# Patient Record
Sex: Female | Born: 1970 | Race: White | Hispanic: No | Marital: Married | Smoking: Never smoker
Health system: Southern US, Community
[De-identification: ages and names within clinical notes are randomized; demographics above are authoritative.]

## PROBLEM LIST (undated history)

## (undated) DIAGNOSIS — E669 Obesity, unspecified: Secondary | ICD-10-CM

## (undated) DIAGNOSIS — J309 Allergic rhinitis, unspecified: Secondary | ICD-10-CM

## (undated) DIAGNOSIS — C801 Malignant (primary) neoplasm, unspecified: Secondary | ICD-10-CM

## (undated) DIAGNOSIS — Z803 Family history of malignant neoplasm of breast: Secondary | ICD-10-CM

## (undated) DIAGNOSIS — K200 Eosinophilic esophagitis: Secondary | ICD-10-CM

## (undated) DIAGNOSIS — Z973 Presence of spectacles and contact lenses: Secondary | ICD-10-CM

## (undated) DIAGNOSIS — R002 Palpitations: Secondary | ICD-10-CM

## (undated) DIAGNOSIS — Z1509 Genetic susceptibility to other malignant neoplasm: Secondary | ICD-10-CM

## (undated) DIAGNOSIS — Z1501 Genetic susceptibility to malignant neoplasm of breast: Secondary | ICD-10-CM

## (undated) DIAGNOSIS — F419 Anxiety disorder, unspecified: Secondary | ICD-10-CM

## (undated) DIAGNOSIS — I493 Ventricular premature depolarization: Secondary | ICD-10-CM

## (undated) HISTORY — DX: Genetic susceptibility to malignant neoplasm of breast: Z15.01

## (undated) HISTORY — PX: UPPER GASTROINTESTINAL ENDOSCOPY: SHX188

## (undated) HISTORY — PX: ADENOIDECTOMY: SUR15

## (undated) HISTORY — DX: Genetic susceptibility to malignant neoplasm of breast: Z15.09

## (undated) HISTORY — DX: Obesity, unspecified: E66.9

## (undated) HISTORY — DX: Family history of malignant neoplasm of breast: Z80.3

## (undated) HISTORY — DX: Eosinophilic esophagitis: K20.0

## (undated) HISTORY — DX: Palpitations: R00.2

---

## 1999-03-25 ENCOUNTER — Ambulatory Visit (HOSPITAL_COMMUNITY): Admission: RE | Admit: 1999-03-25 | Discharge: 1999-03-25 | Payer: Self-pay | Admitting: Obstetrics and Gynecology

## 1999-03-25 ENCOUNTER — Encounter: Payer: Self-pay | Admitting: Obstetrics and Gynecology

## 1999-04-23 ENCOUNTER — Ambulatory Visit (HOSPITAL_COMMUNITY): Admission: RE | Admit: 1999-04-23 | Discharge: 1999-04-23 | Payer: Self-pay | Admitting: Obstetrics and Gynecology

## 1999-04-23 ENCOUNTER — Encounter: Payer: Self-pay | Admitting: Obstetrics and Gynecology

## 1999-08-21 ENCOUNTER — Inpatient Hospital Stay (HOSPITAL_COMMUNITY): Admission: AD | Admit: 1999-08-21 | Discharge: 1999-08-21 | Payer: Self-pay | Admitting: Obstetrics and Gynecology

## 1999-08-25 ENCOUNTER — Inpatient Hospital Stay (HOSPITAL_COMMUNITY): Admission: AD | Admit: 1999-08-25 | Discharge: 1999-08-25 | Payer: Self-pay | Admitting: Obstetrics and Gynecology

## 1999-08-28 ENCOUNTER — Encounter (HOSPITAL_COMMUNITY): Admission: RE | Admit: 1999-08-28 | Discharge: 1999-09-05 | Payer: Self-pay | Admitting: Obstetrics and Gynecology

## 1999-09-04 ENCOUNTER — Inpatient Hospital Stay (HOSPITAL_COMMUNITY): Admission: AD | Admit: 1999-09-04 | Discharge: 1999-09-07 | Payer: Self-pay | Admitting: Obstetrics and Gynecology

## 1999-10-22 ENCOUNTER — Other Ambulatory Visit: Admission: RE | Admit: 1999-10-22 | Discharge: 1999-10-22 | Payer: Self-pay | Admitting: Obstetrics and Gynecology

## 2004-12-05 ENCOUNTER — Emergency Department (HOSPITAL_COMMUNITY): Admission: EM | Admit: 2004-12-05 | Discharge: 2004-12-05 | Payer: Self-pay

## 2004-12-05 IMAGING — CR DG ANKLE COMPLETE 3+V*L*
3 series · 3 of 3 positions shown · non-contrast
Comparison: none

CLINICAL DATA: Ankle injury with lateral pain and swelling.
 LEFT ANKLE ? 3 VIEWS:
 There is lateral soft tissue swelling with a small ankle joint effusion.  No acute fracture or dislocation is demonstrated.  There is a small plantar calcaneal spur.

[view not recorded (1 of 3)]
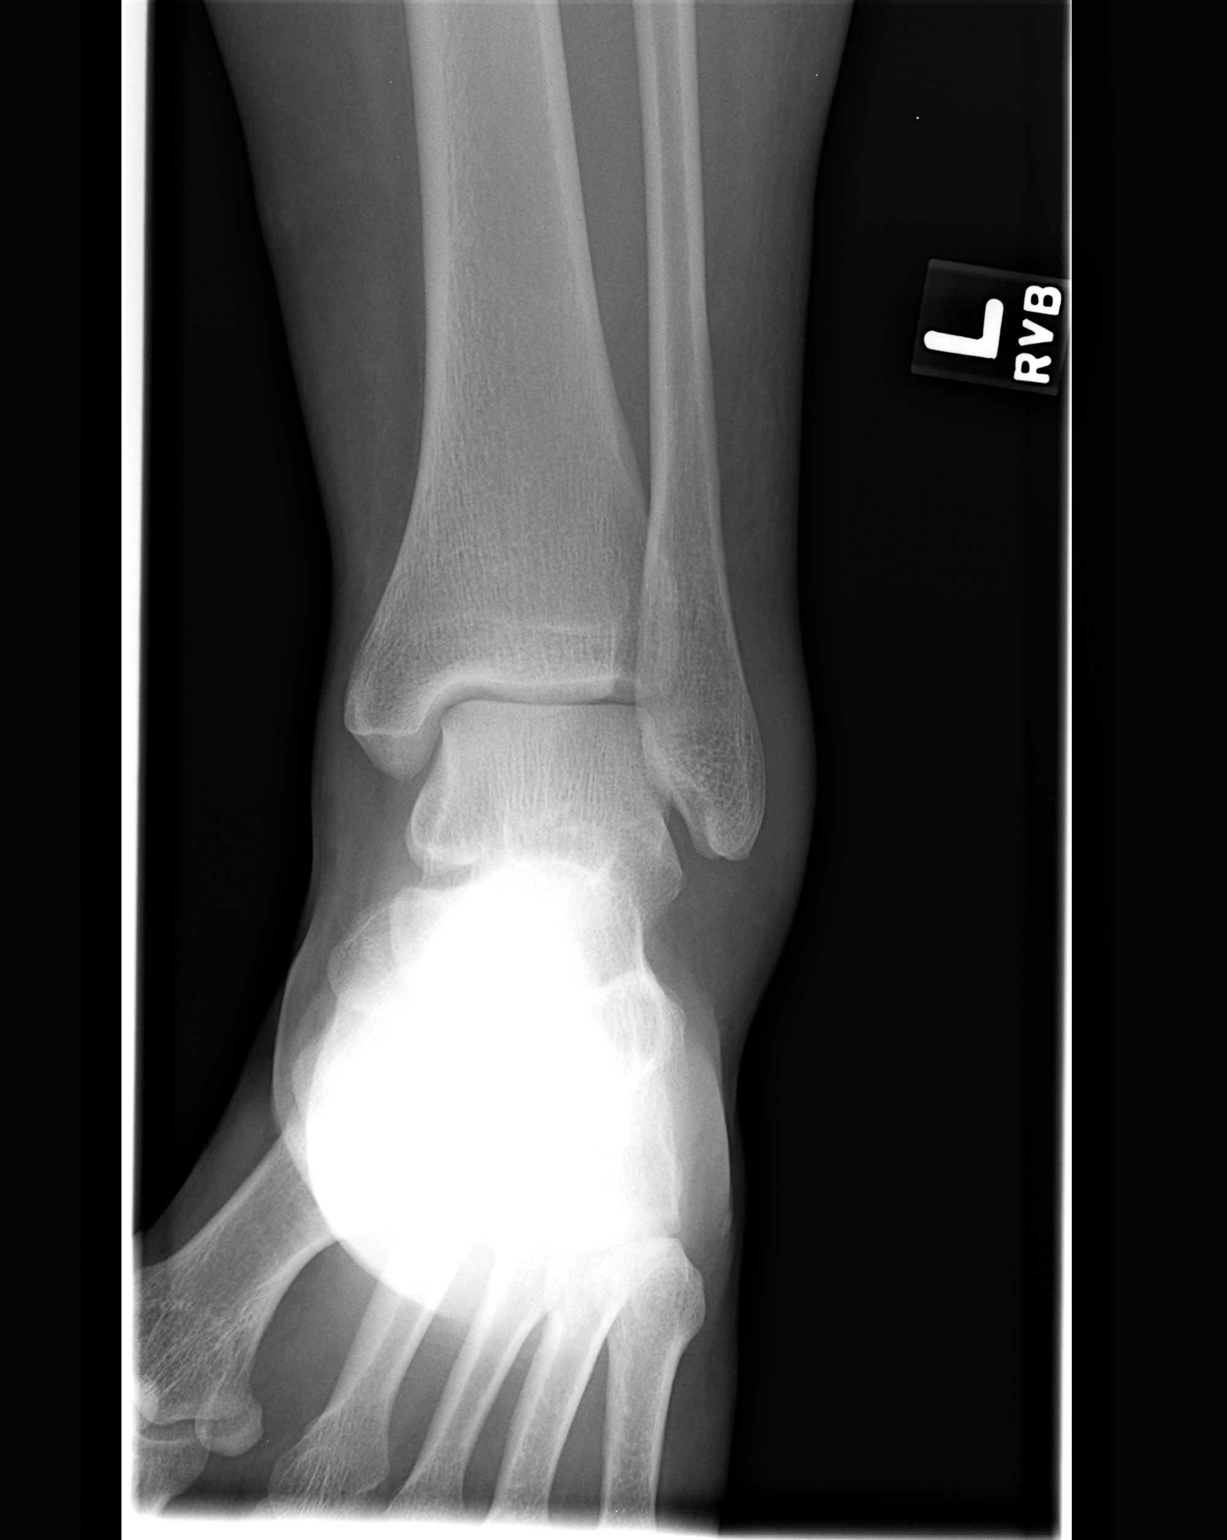

[view not recorded (2 of 3)]
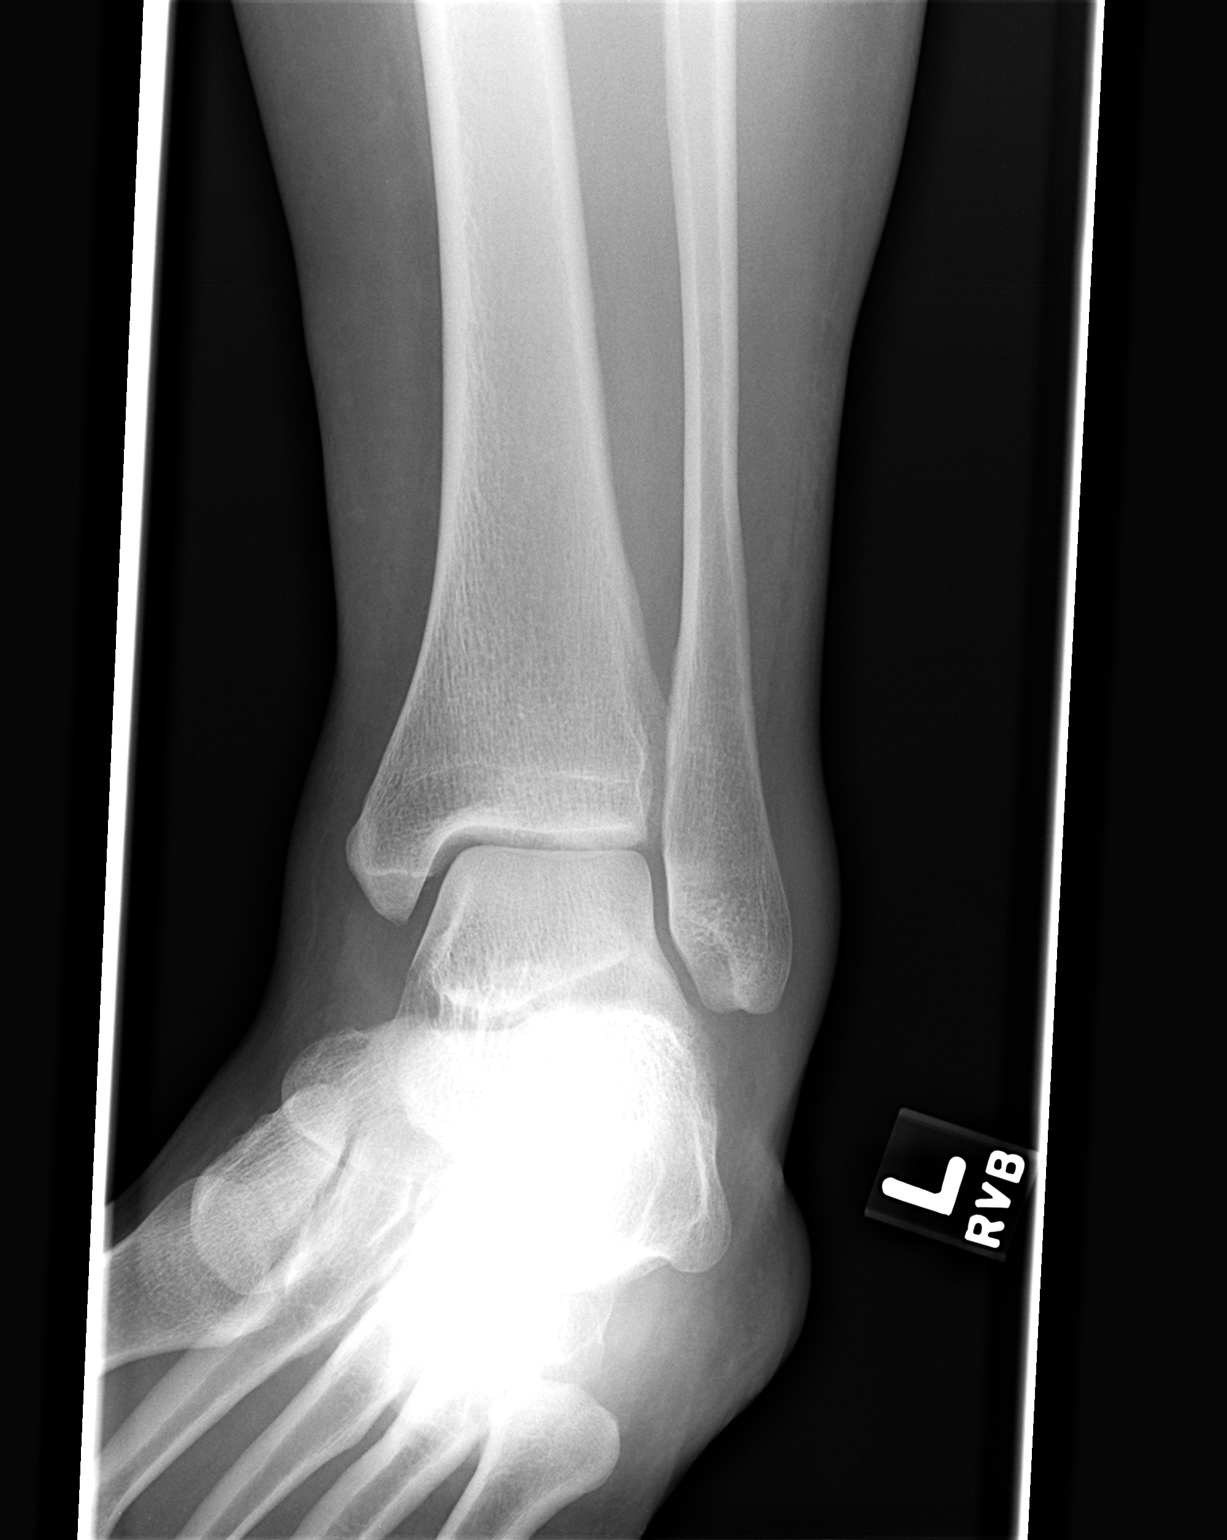

[view not recorded (3 of 3)]
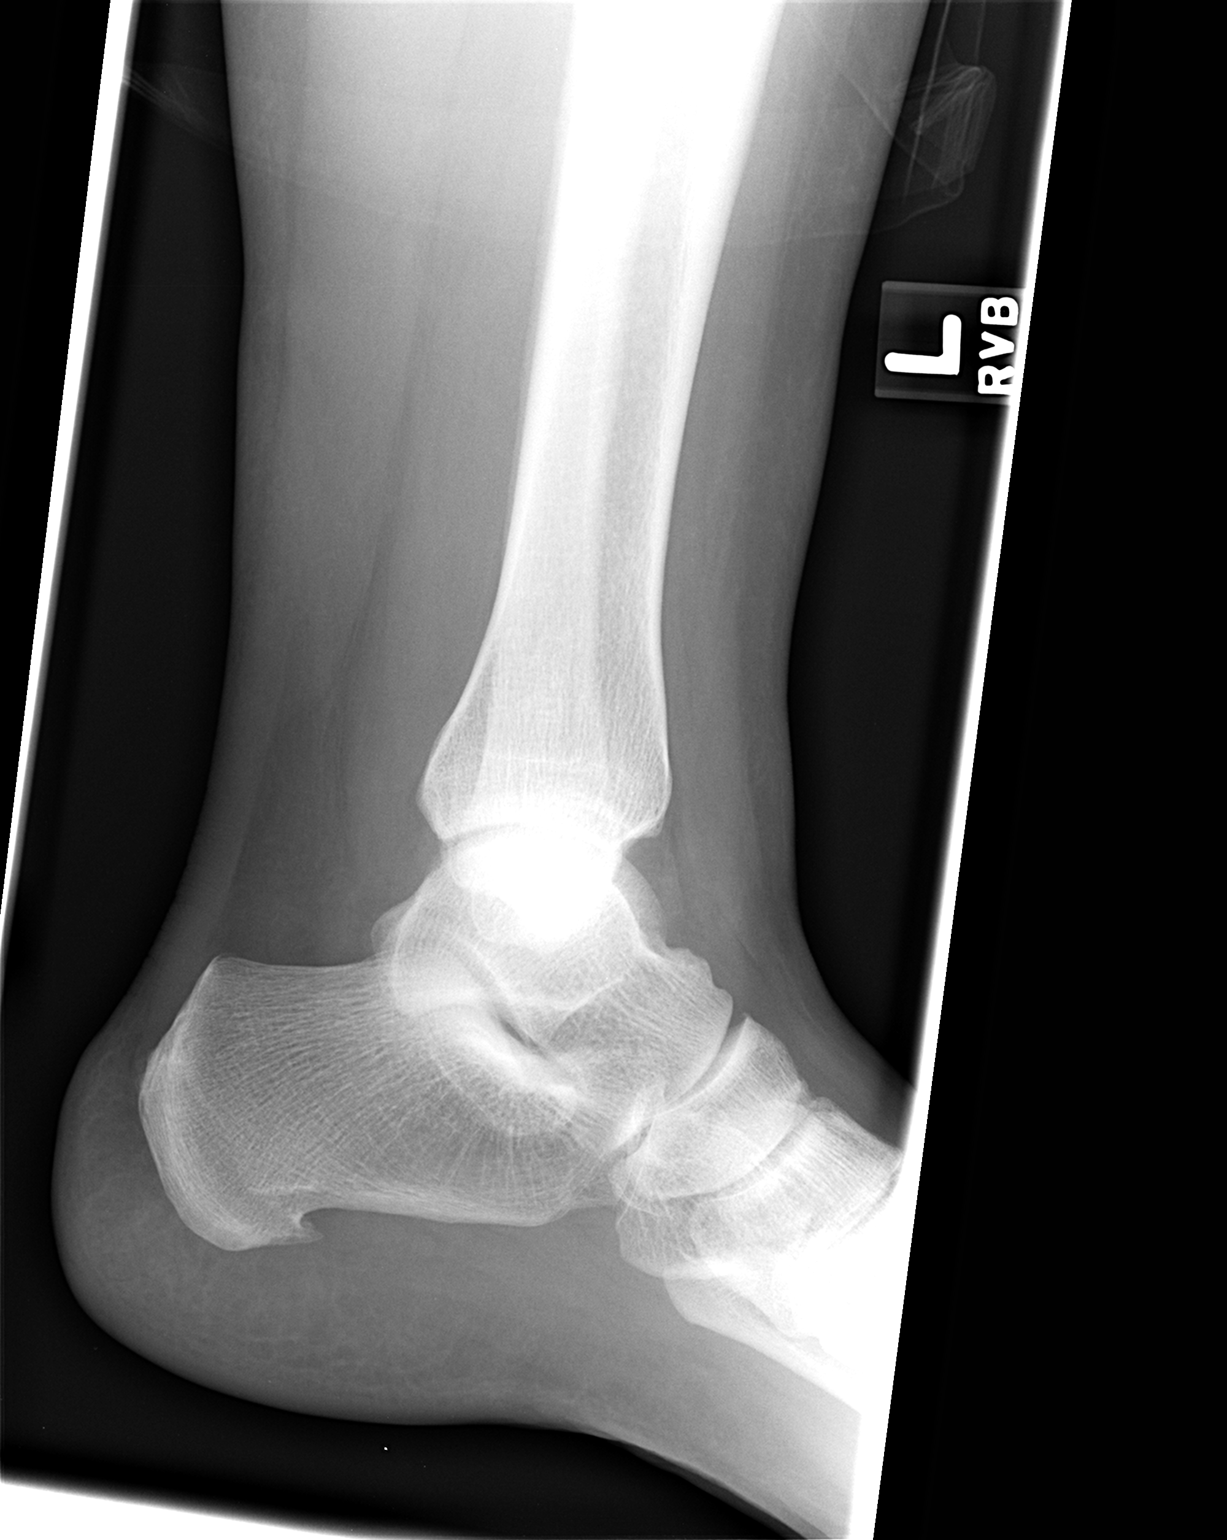

[3 of 3 positions shown; findings below may reference images not displayed]

IMPRESSION: Lateral soft tissue swelling.  No evidence of acute fracture or dislocation.

## 2009-01-10 ENCOUNTER — Encounter: Payer: Self-pay | Admitting: *Deleted

## 2011-10-14 ENCOUNTER — Encounter: Payer: Self-pay | Admitting: *Deleted

## 2011-10-15 ENCOUNTER — Ambulatory Visit (INDEPENDENT_AMBULATORY_CARE_PROVIDER_SITE_OTHER): Payer: 59 | Admitting: Cardiology

## 2011-10-15 ENCOUNTER — Encounter: Payer: Self-pay | Admitting: Cardiology

## 2011-10-15 DIAGNOSIS — I493 Ventricular premature depolarization: Secondary | ICD-10-CM | POA: Insufficient documentation

## 2011-10-15 DIAGNOSIS — E663 Overweight: Secondary | ICD-10-CM

## 2011-10-15 DIAGNOSIS — I4949 Other premature depolarization: Secondary | ICD-10-CM

## 2011-10-15 NOTE — Assessment & Plan Note (Signed)
We discussed diet and exercise strategies.

## 2011-10-15 NOTE — Progress Notes (Signed)
HPI The patient presents for evaluation of PVCs. She has had a history of this and an evaluation by Dr. Reyes Ivan in the past.  I was able to review these records. She had a negative exercise echocardiogram in the past. She's had normal thyroid studies. She does occasionally feel palpitations but these no longer bother her. She might notice him with caffeine. She notices them more with activity than at rest. She denies any presyncope or syncope. She had no chest pressure, neck or arm discomfort. She denies any shortness of breath, weight gain or edema. She's had recent evaluations including a GI evaluation and urgent care visit and they asked her to present again to the cardiologist as these seem to be frequent during their exam.  Allergies  Allergen Reactions  . Other Anaphylaxis    Watermelon, sweet potatoes, cucumbers,and oranges.    Current Outpatient Prescriptions  Medication Sig Dispense Refill  . fluticasone (FLOVENT HFA) 110 MCG/ACT inhaler Inhale 1 puff into the lungs 2 (two) times daily.         Past Medical History  Diagnosis Date  . Obesity   . Eosinophilic esophagitis     Hx of  . Palpitations     Past Surgical History  Procedure Date  . Adenoidectomy     Family History  Problem Relation Age of Onset  . Hyperlipidemia Mother   . Hypertension Mother     History   Social History  . Marital Status: Single    Spouse Name: N/A    Number of Children: 2  . Years of Education: N/A   Occupational History  . Manufacturing   .     Social History Main Topics  . Smoking status: Never Smoker   . Smokeless tobacco: Not on file  . Alcohol Use: Yes     occasional  . Drug Use: Not on file  . Sexually Active: Not on file   Other Topics Concern  . Not on file   Social History Narrative  . No narrative on file    ROS: As stated in the HPI and negative for all other systems.   PHYSICAL EXAM BP 136/77  Pulse 80  Resp 18  Ht 5\' 8"  (1.727 m)  Wt 265 lb 12.8 oz  (120.566 kg)  BMI 40.41 kg/m2 GENERAL:  Well appearing HEENT:  Pupils equal round and reactive, fundi not visualized, oral mucosa unremarkable NECK:  No jugular venous distention, waveform within normal limits, carotid upstroke brisk and symmetric, no bruits, no thyromegaly LYMPHATICS:  No cervical, inguinal adenopathy LUNGS:  Clear to auscultation bilaterally BACK:  No CVA tenderness CHEST:  Unremarkable HEART:  PMI not displaced or sustained,S1 and S2 within normal limits, no S3, no S4, no clicks, no rubs, no murmurs ABD:  Flat, positive bowel sounds normal in frequency in pitch, no bruits, no rebound, no guarding, no midline pulsatile mass, no hepatomegaly, no splenomegaly, obese EXT:  2 plus pulses throughout, no edema, no cyanosis no clubbing SKIN:  No rashes no nodules NEURO:  Cranial nerves II through XII grossly intact, motor grossly intact throughout PSYCH:  Cognitively intact, oriented to person place and time   EKG:  Sinus rhythm, premature ventricular contractions, axis within normal limits, intervals within normal limits, no acute ST-T wave changes.  ASSESSMENT AND PLAN

## 2011-10-15 NOTE — Assessment & Plan Note (Signed)
I would like to check a Holter to quantify these.  However, I doubt that any other cardiac work up will be indicated unless they become symptomatic.

## 2011-10-15 NOTE — Patient Instructions (Signed)
Your physician has recommended that you wear a holter monitor. Holter monitors are medical devices that record the heart's electrical activity. Doctors most often use these monitors to diagnose arrhythmias. Arrhythmias are problems with the speed or rhythm of the heartbeat. The monitor is a small, portable device. You can wear one while you do your normal daily activities. This is usually used to diagnose what is causing palpitations/syncope (passing out).  The current medical regimen is effective;  continue present plan and medications.  Follow up in 2-3 years with Dr Antoine Poche.  You will receive a letter in the mail 2 months before you are due.  Please call us when you receive this letter to schedule your follow up appointment.

## 2011-12-15 DIAGNOSIS — I493 Ventricular premature depolarization: Secondary | ICD-10-CM

## 2011-12-15 HISTORY — DX: Ventricular premature depolarization: I49.3

## 2016-06-09 LAB — HM PAP SMEAR: HPV Aptima: NEGATIVE

## 2016-12-14 HISTORY — PX: OTHER SURGICAL HISTORY: SHX169

## 2017-11-09 DIAGNOSIS — N920 Excessive and frequent menstruation with regular cycle: Secondary | ICD-10-CM | POA: Insufficient documentation

## 2017-11-19 ENCOUNTER — Other Ambulatory Visit: Payer: Self-pay | Admitting: Obstetrics and Gynecology

## 2017-11-19 DIAGNOSIS — R928 Other abnormal and inconclusive findings on diagnostic imaging of breast: Secondary | ICD-10-CM

## 2017-11-30 ENCOUNTER — Ambulatory Visit
Admission: RE | Admit: 2017-11-30 | Discharge: 2017-11-30 | Disposition: A | Discharge status: Home / Self Care | Payer: 59 | Source: Ambulatory Visit | Attending: Obstetrics and Gynecology | Admitting: Obstetrics and Gynecology

## 2017-11-30 ENCOUNTER — Ambulatory Visit: Payer: Self-pay

## 2017-11-30 DIAGNOSIS — R928 Other abnormal and inconclusive findings on diagnostic imaging of breast: Secondary | ICD-10-CM

## 2019-09-20 ENCOUNTER — Ambulatory Visit (INDEPENDENT_AMBULATORY_CARE_PROVIDER_SITE_OTHER): Payer: BC Managed Care – PPO | Admitting: Family Medicine

## 2019-09-20 ENCOUNTER — Encounter: Payer: Self-pay | Admitting: Family Medicine

## 2019-09-20 ENCOUNTER — Other Ambulatory Visit: Payer: Self-pay

## 2019-09-20 VITALS — BP 128/76 | HR 80 | Temp 97.9°F | Resp 16 | Ht 68.0 in | Wt 255.4 lb

## 2019-09-20 DIAGNOSIS — E669 Obesity, unspecified: Secondary | ICD-10-CM

## 2019-09-20 DIAGNOSIS — Z1509 Genetic susceptibility to other malignant neoplasm: Secondary | ICD-10-CM

## 2019-09-20 DIAGNOSIS — Z1501 Genetic susceptibility to malignant neoplasm of breast: Secondary | ICD-10-CM | POA: Diagnosis not present

## 2019-09-20 DIAGNOSIS — Z803 Family history of malignant neoplasm of breast: Secondary | ICD-10-CM | POA: Diagnosis not present

## 2019-09-20 NOTE — Progress Notes (Signed)
Subjective  CC:  Chief Complaint  Patient presents with  . Establish Care    Has not had PCP in years; OB/GYN is Dr. Terri Piedra  . Weight Loss    Curently doing weight watchers and wants to discuss a healthy weight.. Has lost 45 lb since June 2020    HPI: Becky Taylor is a 48 y.o. female who presents to [facility] [place] today to establish care with me as a new patient.   She has the following concerns or needs:  48 year old married female mother of 2 daughters who are now in college presents to establish care.  Recently had annual GYN exam.  Obesity: Has been overweight for most of her adult life.  Typical weight has ranged between 230 and 290 pounds.  Recently started weight watchers and is down 45 pounds in the last 4 months.  She is feeling much better.  Would like to continue to lose more weight.  She has started walking on a regular basis.  Eating healthier.  BRCA positive gene testing: Mother was diagnosed with breast cancer 2 years ago.  She is doing well.  Mother was tested for BRCA and was positive, follow-up testing with patient was also positive.  This was done 1 or 2 years ago, however she has not had follow-up for this test.  She is never been to Dietitian but recently was given a phone number to make an appointment.  She has not told family members about this test.  She is uncertain if she will act upon it.  Her mammogram is up-to-date but she has not had any other sort of high risk surveillance or screening done.  Pap smears up-to-date.  She is elevated risk for breast cancer and ovarian cancer.  Health maintenance: Due for complete physical with lab work.  She is due for Tdap but would like to defer that for now.  Is getting flu shot today at work.  Assessment  1. Obesity (BMI 30-39.9)   2. BRCA gene mutation positive   3. Family history of breast cancer in mother      Plan   Obesity: Had long discussion regarding weight loss goals  and ways to achieve this.  She will increase exercise, strength training continue weight watchers for now.  BRCA gene mutation positive: Recommend referral to geneticist.  Patient will call to schedule.  Discussed options.  Return for complete physical.  Tdap at that time.  Flu shot today.  Follow up:  Return for complete physical. No orders of the defined types were placed in this encounter.  No orders of the defined types were placed in this encounter.    Depression screen PHQ 2/9 09/20/2019  Decreased Interest 0  Down, Depressed, Hopeless 0  PHQ - 2 Score 0    We updated and reviewed the patient's past history in detail and it is documented below.  Patient Active Problem List   Diagnosis Date Noted  . Obesity (BMI 30-39.9) 09/20/2019  . BRCA gene mutation positive 09/20/2019  . Family history of breast cancer in mother 09/20/2019  . Menorrhagia 11/09/2017  . PVC (premature ventricular contraction) 10/15/2011   Health Maintenance  Topic Date Due  . HIV Screening  02/09/1986  . TETANUS/TDAP  02/09/1990  . MAMMOGRAM  07/16/2020  . PAP SMEAR-Modifier  07/16/2022  . INFLUENZA VACCINE  Completed    There is no immunization history on file for this patient. No outpatient medications have been marked  as taking for the 09/20/19 encounter (Office Visit) with Leamon Arnt, MD.    Allergies: Patient is allergic to other. Past Medical History Patient  has a past medical history of Eosinophilic esophagitis, Obesity, and Palpitations. Past Surgical History Patient  has a past surgical history that includes Adenoidectomy. Family History: Patient family history includes BRCA 1/2 in her mother; Breast cancer in her mother; Healthy in her sister; Hyperlipidemia in her mother; Hypertension in her mother; Neurologic Disorder in her father. Social History:  Patient  reports that she has never smoked. She has never used smokeless tobacco. She reports current alcohol use. She reports  that she does not use drugs.  Review of Systems: Constitutional: negative for fever or malaise Ophthalmic: negative for photophobia, double vision or loss of vision Cardiovascular: negative for chest pain, dyspnea on exertion, or new LE swelling Respiratory: negative for SOB or persistent cough Gastrointestinal: negative for abdominal pain, change in bowel habits or melena Genitourinary: negative for dysuria or gross hematuria Musculoskeletal: negative for new gait disturbance or muscular weakness Integumentary: negative for new or persistent rashes Neurological: negative for TIA or stroke symptoms Psychiatric: negative for SI or delusions Allergic/Immunologic: negative for hives  Patient Care Team    Relationship Specialty Notifications Start End  Leamon Arnt, MD PCP - General Family Medicine  09/20/19   Sherlyn Hay, DO Consulting Physician Obstetrics and Gynecology  09/20/19     Objective  Vitals: BP 128/76   Pulse 80   Temp 97.9 F (36.6 C) (Tympanic)   Resp 16   Ht _0  (1.727 m)   Wt 255 lb 6.4 oz (115.8 kg)   SpO2 99%   BMI 38.83 kg/m  General:  Well developed, well nourished, no acute distress  Psych:  Alert and oriented,normal mood and affect HEENT:  Normocephalic, atraumatic, non-icteric sclera, PERRL, oropharynx is without mass or exudate, supple neck without adenopathy, mass or thyromegaly Cardiovascular:  RRR without gallop, rub or murmur, nondisplaced PMI Respiratory:  Good breath sounds bilaterally, CTAB with normal respiratory effort MSK: no deformities, contusions. Joints are without erythema or swelling Neurologic:    Mental status is normal. Gross motor and sensory exams are normal. Normal gait   Commons side effects, risks, benefits, and alternatives for medications and treatment plan prescribed today were discussed, and the patient expressed understanding of the given instructions. Patient is instructed to call or message via MyChart if he/she  has any questions or concerns regarding our treatment plan. No barriers to understanding were identified. We discussed Red Flag symptoms and signs in detail. Patient expressed understanding regarding what to do in case of urgent or emergency type symptoms.   Medication list was reconciled, printed and provided to the patient in AVS. Patient instructions and summary information was reviewed with the patient as documented in the AVS. This note was prepared with assistance of Dragon voice recognition software. Occasional wrong-word or sound-a-like substitutions may have occurred due to the inherent limitations of voice recognition software

## 2019-09-20 NOTE — Patient Instructions (Signed)
Please return for your annual complete physical; please come fasting.  Please call for your genetics appointment. Let me know if you have any questions afterwards that I can help with.  And keep up the healthy lifestyle changes!! You are doing great!  It was a pleasure meeting you today! Thank you for choosing [place] to meet your healthcare needs! I truly look forward to working with you. If you have any questions or concerns, please send me a message via Mychart or call the office at [phone].   BMI for Adults  Body mass index (BMI) is a number that is calculated from a person's weight and height. BMI may help to estimate how much of a person's weight is composed of fat. BMI can help identify those who may be at higher risk for certain medical problems. How is BMI used with adults? BMI is used as a screening tool to identify possible weight problems. It is used to check whether a person is obese, overweight, healthy weight, or underweight. How is BMI calculated? BMI measures your weight and compares it to your height. This can be done either in [place] ([place] or metric measurements. Note that charts are available to help you find your BMI quickly and easily without having to do these calculations yourself. To calculate your BMI in [place] ([place] measurements, your health care provider will: 1. Measure your weight in pounds (lb). 2. Multiply the number of pounds by 703. ? For example, for a person who weighs 180 lb, multiply that number by 703, which equals 126,540. 3. Measure your height in inches (in). Then multiply that number by itself to get a measurement called "inches squared." ? For example, for a person who is 70 in tall, the "inches squared" measurement is 70 in x 70 in, which equals 4900 inches squared. 4. Divide the total from Step 2 (number of lb x 703) by the total from Step 3 (inches squared): 126,540  4900 = 25.8. This is your BMI. To calculate your BMI in metric measurements,  your health care provider will: 1. Measure your weight in kilograms (kg). 2. Measure your height in meters (m). Then multiply that number by itself to get a measurement called "meters squared." ? For example, for a person who is 1.75 m tall, the "meters squared" measurement is 1.75 m x 1.75 m, which is equal to 3.1 meters squared. 3. Divide the number of kilograms (your weight) by the meters squared number. In this example: 70  3.1 = 22.6. This is your BMI. How is BMI interpreted? To interpret your results, your health care provider will use BMI charts to identify whether you are underweight, normal weight, overweight, or obese. The following guidelines will be used:  Underweight: BMI less than 18.5.  Normal weight: BMI between 18.5 and 24.9.  Overweight: BMI between 25 and 29.9.  Obese: BMI of 30 and above. Please note:  Weight includes both fat and muscle, so someone with a muscular build, such as an athlete, may have a BMI that is higher than 24.9. In cases like these, BMI is not an accurate measure of body fat.  To determine if excess body fat is the cause of a BMI of 25 or higher, further assessments may need to be done by a health care provider.  BMI is usually interpreted in the same way for men and women. Why is BMI a useful tool? BMI is useful in two ways:  Identifying a weight problem that may be related to  a medical condition, or that may increase the risk for medical problems.  Promoting lifestyle and diet changes in order to reach a healthy weight. Summary  Body mass index (BMI) is a number that is calculated from a person's weight and height.  BMI may help to estimate how much of a person's weight is composed of fat. BMI can help identify those who may be at higher risk for certain medical problems.  BMI can be measured using English measurements or metric measurements.  To interpret your results, your health care provider will use BMI charts to identify whether you  are underweight, normal weight, overweight, or obese. This information is not intended to replace advice given to you by your health care provider. Make sure you discuss any questions you have with your health care provider. Document Released: 08/11/2004 Document Revised: 11/12/2017 Document Reviewed: 10/13/2017 Elsevier Patient Education  2020 [employer].

## 2019-09-21 ENCOUNTER — Encounter: Payer: Self-pay | Admitting: *Deleted

## 2019-10-24 ENCOUNTER — Other Ambulatory Visit: Payer: Self-pay

## 2019-10-24 ENCOUNTER — Ambulatory Visit (INDEPENDENT_AMBULATORY_CARE_PROVIDER_SITE_OTHER): Payer: BC Managed Care – PPO | Admitting: Family Medicine

## 2019-10-24 ENCOUNTER — Encounter: Payer: Self-pay | Admitting: Family Medicine

## 2019-10-24 VITALS — BP 122/72 | HR 81 | Temp 98.7°F | Ht 68.0 in | Wt 247.0 lb

## 2019-10-24 DIAGNOSIS — Z1501 Genetic susceptibility to malignant neoplasm of breast: Secondary | ICD-10-CM

## 2019-10-24 DIAGNOSIS — G250 Essential tremor: Secondary | ICD-10-CM

## 2019-10-24 DIAGNOSIS — Z0000 Encounter for general adult medical examination without abnormal findings: Secondary | ICD-10-CM

## 2019-10-24 DIAGNOSIS — Z23 Encounter for immunization: Secondary | ICD-10-CM

## 2019-10-24 DIAGNOSIS — E669 Obesity, unspecified: Secondary | ICD-10-CM | POA: Diagnosis not present

## 2019-10-24 DIAGNOSIS — Z1509 Genetic susceptibility to other malignant neoplasm: Secondary | ICD-10-CM

## 2019-10-24 LAB — COMPREHENSIVE METABOLIC PANEL
ALT: 10 U/L (ref 0–35)
AST: 11 U/L (ref 0–37)
Albumin: 4 g/dL (ref 3.5–5.2)
Alkaline Phosphatase: 55 U/L (ref 39–117)
BUN: 10 mg/dL (ref 6–23)
CO2: 27 mEq/L (ref 19–32)
Calcium: 8.9 mg/dL (ref 8.4–10.5)
Chloride: 105 mEq/L (ref 96–112)
Creatinine, Ser: 0.62 mg/dL (ref 0.40–1.20)
GFR: 102.44 mL/min (ref >60.00)
Glucose, Bld: 92 mg/dL (ref 70–99)
Potassium: 4.4 mEq/L (ref 3.5–5.1)
Sodium: 138 mEq/L (ref 135–145)
Total Bilirubin: 0.3 mg/dL (ref 0.2–1.2)
Total Protein: 6.6 g/dL (ref 6.0–8.3)

## 2019-10-24 LAB — CBC WITH DIFFERENTIAL/PLATELET
Basophils Absolute: 0.1 10*3/uL (ref 0.0–0.1)
Basophils Relative: 1 % (ref 0.0–3.0)
Eosinophils Absolute: 0.2 10*3/uL (ref 0.0–0.7)
Eosinophils Relative: 3.5 % (ref 0.0–5.0)
HCT: 39.2 % (ref 36.0–46.0)
Hemoglobin: 13.1 g/dL (ref 12.0–15.0)
Lymphocytes Relative: 22.1 % (ref 12.0–46.0)
Lymphs Abs: 1.3 10*3/uL (ref 0.7–4.0)
MCHC: 33.4 g/dL (ref 30.0–36.0)
MCV: 86.8 fl (ref 78.0–100.0)
Monocytes Absolute: 0.4 10*3/uL (ref 0.1–1.0)
Monocytes Relative: 6.7 % (ref 3.0–12.0)
Neutro Abs: 3.8 10*3/uL (ref 1.4–7.7)
Neutrophils Relative %: 66.7 % (ref 43.0–77.0)
Platelets: 304 10*3/uL (ref 150.0–400.0)
RBC: 4.52 Mil/uL (ref 3.87–5.11)
RDW: 14.3 % (ref 11.5–15.5)
WBC: 5.8 10*3/uL (ref 4.0–10.5)

## 2019-10-24 LAB — LIPID PANEL
Cholesterol: 177 mg/dL (ref 0–200)
HDL: 40.4 mg/dL (ref >39.00)
LDL Cholesterol: 113 mg/dL — ABNORMAL HIGH (ref 0–99)
NonHDL: 137.02
Total CHOL/HDL Ratio: 4
Triglycerides: 119 mg/dL (ref 0.0–149.0)
VLDL: 23.8 mg/dL (ref 0.0–40.0)

## 2019-10-24 LAB — TSH: TSH: 1.94 u[IU]/mL (ref 0.35–4.50)

## 2019-10-24 NOTE — Patient Instructions (Addendum)
Please return in 12 months for your annual complete physical; please come fasting.  I will release your lab results to you on your MyChart account with further instructions. Please reply with any questions.   Today you were given your Tdap vaccination.   Please call for your genetic counselor appointment today! :)  If you have any questions or concerns, please don't hesitate to send me a message via MyChart or call the office at [phone]. Thank you for visiting with [place] today! It's our pleasure caring for you.  Please do these things to maintain good health!   Exercise at least 30-45 minutes a day,  4-5 days a week.   Eat a low-fat diet with lots of fruits and vegetables, up to 7-9 servings per day.  Drink plenty of water daily. Try to drink 8 8oz glasses per day.  Seatbelts can save your life. Always wear your seatbelt.  Place Smoke Detectors on every level of your home and check batteries every year.  Schedule an appointment with an eye doctor for an eye exam every 1-2 years  Safe sex - use condoms to protect yourself from STDs if you could be exposed to these types of infections. Use birth control if you do not want to become pregnant and are sexually active.  Avoid heavy alcohol use. If you drink, keep it to less than 2 drinks/day and not every day.  Faith.  Choose someone you trust that could speak for you if you became unable to speak for yourself.  Depression is common in our stressful world.If you're feeling down or losing interest in things you normally enjoy, please come in for a visit.  If anyone is threatening or hurting you, please get help. Physical or Emotional Violence is never OK.    Essential Tremor A tremor is trembling or shaking that a person cannot control. Most tremors affect the hands or arms. Tremors can also affect the head, vocal cords, legs, and other parts of the body. Essential tremor is a tremor without a known cause.  Usually, it occurs while a person is trying to perform an action. It tends to get worse gradually as a person ages. What are the causes? The cause of this condition is not known. What increases the risk? You are more likely to develop this condition if:  You have a family member with essential tremor.  You are age 48 or older.  You take certain medicines. What are the signs or symptoms? The main sign of a tremor is a rhythmic shaking of certain parts of your body that is uncontrolled and unintentional. You may:  Have difficulty eating with a spoon or fork.  Have difficulty writing.  Nod your head up and down or side to side.  Have a quivering voice. The shaking may:  Get worse over time.  Come and go.  Be more noticeable on one side of your body.  Get worse due to stress, fatigue, caffeine, and extreme heat or cold. How is this diagnosed? This condition may be diagnosed based on:  Your symptoms and medical history.  A physical exam. There is no single test to diagnose an essential tremor. However, your health care provider may order tests to rule out other causes of your condition. These may include:  Blood and urine tests.  Imaging studies of your brain, such as CT scan and MRI.  A test that measures involuntary muscle movement (electromyogram). How is this treated? Treatment for essential  tremor depends on the severity of the condition.  Some tremors may go away without treatment.  Mild tremors may not need treatment if they do not affect your day-to-day life.  Severe tremors may need to be treated using one or more of the following options: ? Medicines. ? Lifestyle changes. ? Occupational or physical therapy. Follow these instructions at home: Lifestyle   Do not use any products that contain nicotine or tobacco, such as cigarettes and e-cigarettes. If you need help quitting, ask your health care provider.  Limit your caffeine intake as told by your  health care provider.  Try to get 8 hours of sleep each night.  Find ways to manage your stress that fits your lifestyle and personality. Consider trying meditation or yoga.  Try to anticipate stressful situations and allow extra time to manage them.  If you are struggling emotionally with the effects of your tremor, consider working with a mental health provider. General instructions  Take over-the-counter and prescription medicines only as told by your health care provider.  Avoid extreme heat and extreme cold.  Keep all follow-up visits as told by your health care provider. This is important. Visits may include physical therapy visits. Contact a health care provider if:  You experience any changes in the location or intensity of your tremors.  You start having a tremor after starting a new medicine.  You have tremor with other symptoms, such as: ? Numbness. ? Tingling. ? Pain. ? Weakness.  Your tremor gets worse.  Your tremor interferes with your daily life.  You feel down, blue, or sad for at least 2 weeks in a row.  Worrying about your tremor and what other people think about you interferes with your everyday life functions, including relationships, work, or school. Summary  Essential tremor is a tremor without a known cause. Usually, it occurs when you are trying to perform an action.  The cause of this condition is not known.  The main sign of a tremor is a rhythmic shaking of certain parts of your body that is uncontrolled and unintentional.  Treatment for essential tremor depends on the severity of the condition. This information is not intended to replace advice given to you by your health care provider. Make sure you discuss any questions you have with your health care provider. Document Released: 12/21/2014 Document Revised: 12/10/2017 Document Reviewed: 12/10/2017 Elsevier Patient Education  2020 [employer].

## 2019-10-24 NOTE — Progress Notes (Signed)
Subjective  Chief Complaint  Patient presents with  . Annual Exam    HPI: Becky Taylor is a 48 y.o. female who presents to [facility] today for a Female Wellness Visit.   Wellness Visit: annual visit with health maintenance review and exam without Pap   HM: GYN exam by Dr. Terri Piedra. + BRCA: still needs to get set up with genetic counselor. Weight loss continues to go well. Averaging about 7-10 pounds per month: exercise and weight watchers. No new concerns.  Wt Readings from Last 3 Encounters:  10/24/19 247 lb (112 kg)  09/20/19 255 lb 6.4 oz (115.8 kg)  10/15/11 265 lb 12.8 oz (120.6 kg)    Assessment  1. Annual physical exam   2. BRCA gene mutation positive   3. Obesity (BMI 30-39.9)   4. Essential tremor      Plan  Female Wellness Visit:  Age appropriate Health Maintenance and Prevention measures were discussed with patient. Included topics are cancer screening recommendations, ways to keep healthy (see AVS) including dietary and exercise recommendations, regular eye and dental care, use of seat belts, and avoidance of moderate alcohol use and tobacco use.   BMI: discussed patient's BMI and encouraged positive lifestyle modifications to help get to or maintain a target BMI.  HM needs and immunizations were addressed and ordered. See below for orders. See HM and immunization section for updates. tdap today  Routine labs and screening tests ordered including cmp, cbc and lipids where appropriate.  Discussed recommendations regarding Vit D and calcium supplementation (see AVS)  Referral to genetics counselor per gyn; pt to call for appt. Discussed importance.   New dx: mild essential tremor. Education given and monitor for now. Not terribly bothersome. Check tsh.   Follow up: Return in about 1 year (around 10/23/2020) for complete physical.   Orders Placed This Encounter  Procedures  . CBC with Differential/Platelet  . Comprehensive  metabolic panel  . Lipid panel  . HIV antibody  . TSH   No orders of the defined types were placed in this encounter.    Lifestyle: Body mass index is 37.56 kg/m. Wt Readings from Last 3 Encounters:  10/24/19 247 lb (112 kg)  09/20/19 255 lb 6.4 oz (115.8 kg)  10/15/11 265 lb 12.8 oz (120.6 kg)    Patient Active Problem List   Diagnosis Date Noted  . Essential tremor 10/24/2019  . Obesity (BMI 30-39.9) 09/20/2019  . BRCA gene mutation positive 09/20/2019  . Family history of breast cancer in mother 09/20/2019  . Menorrhagia 11/09/2017  . PVC (premature ventricular contraction) 10/15/2011   Health Maintenance  Topic Date Due  . HIV Screening  02/09/1986  . TETANUS/TDAP  02/09/1990  . MAMMOGRAM  07/16/2020  . PAP SMEAR-Modifier  07/16/2022  . INFLUENZA VACCINE  Completed    There is no immunization history on file for this patient. We updated and reviewed the patient's past history in detail and it is documented below. Allergies: Patient is allergic to other. Past Medical History Patient  has a past medical history of Eosinophilic esophagitis, Obesity, and Palpitations. Past Surgical History Patient  has a past surgical history that includes Adenoidectomy. Family History: Patient family history includes Arthritis in her paternal grandmother; Asthma in her paternal grandmother; BRCA 1/2 in her mother; Breast cancer in her mother; Cancer in her maternal grandfather and paternal grandfather; Healthy in her sister; Hearing loss in her paternal grandfather and paternal grandmother; Heart attack in her  maternal grandfather; Heart disease in her maternal grandfather; High Cholesterol in her maternal grandmother; Hyperlipidemia in her mother; Hypertension in her mother; Neurologic Disorder in her father; Stroke in her maternal grandmother. Social History:  Patient  reports that she has never smoked. She has never used smokeless tobacco. She reports current alcohol use. She reports  that she does not use drugs.  Review of Systems: Constitutional: negative for fever or malaise Ophthalmic: negative for photophobia, double vision or loss of vision Cardiovascular: negative for chest pain, dyspnea on exertion, or new LE swelling Respiratory: negative for SOB or persistent cough Gastrointestinal: negative for abdominal pain, change in bowel habits or melena Genitourinary: negative for dysuria or gross hematuria, no abnormal uterine bleeding or disharge Musculoskeletal: negative for new gait disturbance or muscular weakness Integumentary: negative for new or persistent rashes, no breast lumps Neurological: negative for TIA or stroke symptoms Psychiatric: negative for SI or delusions Allergic/Immunologic: negative for hives  Patient Care Team    Relationship Specialty Notifications Start End  Leamon Arnt, MD PCP - General Family Medicine  09/20/19   Sherlyn Hay, DO Consulting Physician Obstetrics and Gynecology  09/20/19     Objective  Vitals: BP 122/72 (BP Location: Left Arm, Patient Position: Sitting, Cuff Size: Normal)   Pulse 81   Temp 98.7 F (37.1 C) (Temporal)   Ht _0  (1.727 m)   Wt 247 lb (112 kg)   SpO2 98%   BMI 37.56 kg/m  General:  Well developed, well nourished, no acute distress  Psych:  Alert and orientedx3,normal mood and affect HEENT:  Normocephalic, atraumatic, non-icteric sclera, PERRL, oropharynx is clear without mass or exudate, supple neck without adenopathy, mass or thyromegaly Cardiovascular:  Normal S1, S2, RRR without gallop, rub or murmur, nondisplaced PMI Respiratory:  Good breath sounds bilaterally, CTAB with normal respiratory effort Gastrointestinal: normal bowel sounds, soft, non-tender, no noted masses. No HSM MSK: no deformities, contusions. Joints are without erythema or swelling. Spine and CVA region are nontender Skin:  Warm, no rashes or suspicious lesions noted Neurologic:    Mental status is normal. CN 2-11  are normal. Gross motor and sensory exams are normal. Normal gait. + fine tremor   Commons side effects, risks, benefits, and alternatives for medications and treatment plan prescribed today were discussed, and the patient expressed understanding of the given instructions. Patient is instructed to call or message via MyChart if he/she has any questions or concerns regarding our treatment plan. No barriers to understanding were identified. We discussed Red Flag symptoms and signs in detail. Patient expressed understanding regarding what to do in case of urgent or emergency type symptoms.   Medication list was reconciled, printed and provided to the patient in AVS. Patient instructions and summary information was reviewed with the patient as documented in the AVS. This note was prepared with assistance of Dragon voice recognition software. Occasional wrong-word or sound-a-like substitutions may have occurred due to the inherent limitations of voice recognition software

## 2019-10-25 LAB — HIV ANTIBODY (ROUTINE TESTING W REFLEX): HIV 1&2 Ab, 4th Generation: NONREACTIVE

## 2019-10-31 ENCOUNTER — Telehealth: Payer: Self-pay | Admitting: Genetic Counselor

## 2019-10-31 NOTE — Telephone Encounter (Signed)
A genetic counseling appt has been scheduled for Becky Taylor to see Roma Kayser on 12/7 at 9am. Pt aware to arrive 15 minutes early.

## 2019-11-10 ENCOUNTER — Telehealth: Payer: Self-pay | Admitting: Genetic Counselor

## 2019-11-10 NOTE — Telephone Encounter (Signed)
Called patient regarding 12/07 appointment, left a voicemail.

## 2019-11-17 ENCOUNTER — Telehealth: Payer: Self-pay | Admitting: Genetic Counselor

## 2019-11-17 NOTE — Telephone Encounter (Signed)
Called patient regarding upcoming virtual visit, patient is notified. Labs cancelled. 

## 2019-11-20 ENCOUNTER — Encounter: Payer: Self-pay | Admitting: Genetic Counselor

## 2019-11-20 ENCOUNTER — Other Ambulatory Visit: Payer: BC Managed Care – PPO

## 2019-11-20 ENCOUNTER — Inpatient Hospital Stay: Payer: BC Managed Care – PPO | Attending: Genetic Counselor | Admitting: Genetic Counselor

## 2019-11-20 DIAGNOSIS — Z15068 Genetic susceptibility to other malignant neoplasm of digestive system: Secondary | ICD-10-CM | POA: Insufficient documentation

## 2019-11-20 DIAGNOSIS — Z803 Family history of malignant neoplasm of breast: Secondary | ICD-10-CM | POA: Insufficient documentation

## 2019-11-20 DIAGNOSIS — Z1501 Genetic susceptibility to malignant neoplasm of breast: Secondary | ICD-10-CM

## 2019-11-20 DIAGNOSIS — Z1509 Genetic susceptibility to other malignant neoplasm: Secondary | ICD-10-CM | POA: Insufficient documentation

## 2019-11-20 NOTE — Progress Notes (Signed)
REFERRING PROVIDER: °Andy, Camille L, MD °[address] °[place],  [place] [address] ° °PRIMARY PROVIDER:  °Andy, Camille L, MD ° °PRIMARY REASON FOR VISIT:  °1. Family history of breast cancer   °2. BRCA2 gene mutation positive   ° ° ° °HISTORY OF PRESENT ILLNESS:  I connected with  Ms. Molyneux on 11/20/2019 at 9 AM EDT by MyChart video conference and verified that I am speaking with the correct person using two identifiers.  ° °Patient location: [place]  °Provider location: [place] ° °Ms. Daher, a 48 y.o. female, was seen for a [facility] cancer genetics consultation at the request of Dr. Andy due to a family history of breast cancer and a known familial mutation in BRCA2 for which she had tested positive back in 2018.  Ms. Dust presents to clinic today to discuss the possibility of a hereditary predisposition to cancer, genetic testing, and to further clarify her future cancer risks, as well as potential cancer risks for family members.  ° °Ms. Hergert is a 48 y.o. female with no personal history of cancer.  She had a discussion about a BSO with Dr. Banga at her last visit and decided that she needed to discuss the testing further. ° °CANCER HISTORY:  °Oncology History  ° No history exists.  ° ° ° °RISK FACTORS:  °Menarche was at age 13.  °First live birth at age 26.  °OCP use for approximately several years.  °Ovaries intact: yes.  °Hysterectomy: no.  °Menopausal status: perimenopausal.  °HRT use: 0 years. °Colonoscopy: no; not examined. °Mammogram within the last year: yes. °Number of breast biopsies: 0. °Up to date with pelvic exams: yes. °Any excessive radiation exposure in the past: no ° °Past Medical History:  °Diagnosis Date  °• BRCA2 gene mutation positive   °• Eosinophilic esophagitis   ° Hx of  °• Family history of breast cancer   °• Obesity   °• Palpitations   ° ° °Past Surgical History:  °Procedure Laterality Date  °• ADENOIDECTOMY    ° ° °Social History  ° °Socioeconomic History    °• Marital status: Married  °  Spouse name: Not on file  °• Number of children: 2  °• Years of education: Not on file  °• Highest education level: Not on file  °Occupational History  °• Occupation: wholesale buyer  °  Employer: [employer]  °Social Needs  °• Financial resource strain: Not on file  °• Food insecurity  °  Worry: Not on file  °  Inability: Not on file  °• Transportation needs  °  Medical: Not on file  °  Non-medical: Not on file  °Tobacco Use  °• Smoking status: Never Smoker  °• Smokeless tobacco: Never Used  °Substance and Sexual Activity  °• Alcohol use: Yes  °  Comment: occasional  °• Drug use: Never  °• Sexual activity: Yes  °  Birth control/protection: None  °Lifestyle  °• Physical activity  °  Days per week: Not on file  °  Minutes per session: Not on file  °• Stress: Not on file  °Relationships  °• Social connections  °  Talks on phone: Not on file  °  Gets together: Not on file  °  Attends religious service: Not on file  °  Active member of club or organization: Not on file  °  Attends meetings of clubs or organizations: Not on file  °  Relationship status: Not on file  °Other Topics Concern  °•   Not on file  Social History Narrative   Not on file     FAMILY HISTORY:  We obtained a detailed, 4-generation family history.  Significant diagnoses are listed below: Family History  Problem Relation Age of Onset   Hyperlipidemia Mother    Hypertension Mother    BRCA 1/2 Mother    Breast cancer Mother 70   Neurologic Disorder Father        progressive supranuclear palsy   Healthy Sister        BRCA negative   BRCA 1/2 Sister        GT neg   High Cholesterol Maternal Grandmother    Stroke Maternal Grandmother    Cancer Maternal Grandfather    Heart attack Maternal Grandfather    Heart disease Maternal Grandfather    Arthritis Paternal Grandmother    Asthma Paternal Grandmother    Hearing loss Paternal Grandmother    Cancer Paternal Grandfather    Hearing  loss Paternal Grandfather     The patient has two daughters who are cancer free.  She has a sister who has had genetic testing and is reportedly negative.  Both parents are living.  The patient's father is living and cancer free.  He has a brother and two sisters who reportedly do not have cancer.  The paternal grandfather had liver cancer and the grandmother died of non-cancer related issues.  The patient's mother had breast cancer at age 58 and genetic testing found a BRCA mutation.  She has a brother and two sisters who are cancer free.  One sister had genetic testing and is reportedly negative.  The maternal grandparents both died of non cancer related issues.  Reportedly the grandmother had several siblings who died of cancer and her mother had breast cancer and died at 61.  Ms. Shew is aware of previous family history of genetic testing for hereditary cancer risks. Patient's maternal ancestors are of [place] descent, and paternal ancestors are of [place] descent. There is no reported Ashkenazi Jewish ancestry. There is no known consanguinity.    GENETIC TESTING:  In 2018, Ms. Pam had genetic testing through her OB/GYN office. The genetic testing reported on 12/03/2017 through the Comprehensive BRCA1/2 Analysis Cancer Panel offered by [employer] which identified a single, heterozygous pathogenic gene mutation called BRCA2, [phone].     MEDICAL MANAGEMENT: Women who have a BRCA mutation have an increased risk for both breast and ovarian cancer.   As discussed with Ms. Danner, to reduce the risk for breast cancer, prophylactic bilateral mastectomy is the most effective option for risk reduction. However, for women who choose to keep their breasts intensified screening is equally safe.  We recommend yearly mammograms, yearly breast MRI, twice-yearly clinical breast exams, and monthly self-breast exams. Ms. Beissel will be seen in our high risk clinic to discuss  surgical options.  To reduce the risk for ovarian cancer, we recommend Ms. Kludt have a prophylactic bilateral salpingo-oophorectomy when childbearing is completed, if planned. We discussed that screening with CA-125 blood tests and transvaginal ultrasounds can be done twice per year. However, these tests have not been shown to detect ovarian cancer at an early stage.  Ms. Vessels will follow up with Dr. Terri Piedra in regards to her ovarian cancer risk.  She reports that they have already had discussions about a TAH-BSO, but will discuss this more in detail with her.  RISK REDUCTION: There are several things that can be offered to individuals who are carriers for BRCA mutations  that will reduce the risk for getting cancer.   ° °The use of oral contraceptives can lower the risk for ovarian cancer, and, per case control studies, does not significantly increase the risk for breast cancer in BRCA patients.  Case control studies have shown that oral contraceptives can lower the risk for ovarian cancer in women with BRCA mutations. Additionally, a more recent meta-analysis, including one cohort (n=3,181) and one case control study (1,096 cases and 2,878 controls) also showed an inverse correlation between ovarian cancer and ever having used oral contraceptives (OR, 0.58; 95% CI = 0.46-0.73).  Studies on oral contraceptives and breast cancer have been conflicting, with some studies suggesting that there is not an increased risk for breast cancer in BRCA mutation carriers, while others suggest that there could be a risk.  That said, two meta-analysis studies have shown that there is not an increased risk for breast cancer with oral contraceptive use in BRCA1 and BRCA2 carriers.   ° °In individuals who have a prophylactic bilateral salpingo-oophorectomy (BSO), the risk for breast cancer is reduced by up to 50%.  It has been reported that short term hormone replacement therapy in women undergoing prophylactic BSO does  not negate the reduction of breast cancer risk associated with surgery (1.2018 NCCN guidelines). ° °FAMILY MEMBERS: It is important that all of Ms. Rodriges's relatives (both men and women) know of the presence of this gene mutation. Site-specific genetic testing can sort out who in the family is at risk and who is not.  She reports that her mother discussed her positive genetic test with extended family members, but that Ms. Taulbee has not told her family of her testing or her test result.  She is anxious about how to tell her children about this result.  We discussed screening and when we would recommend her daughters start cancer screening.   ° °Ms. Vereen's daughters have a 50% chance to have inherited this mutation. We recommend they have genetic testing for this same mutation, as identifying the presence of this mutation would allow them to also take advantage of risk-reducing measures. Both daughters are under age 25, so this may not necessary for a couple years.  I will send Ms. Miyazaki a brochure about how to tell children about BRCA mutations.  We discussed that this brochure is really geared toward younger children, but that it can be extrapolated to older children as well. ° °Additionally, individuals with a pathogenic variant in BRCA2 are carriers of Fanconi anemia. Fanconi anemia is an autosomal recessive disorder that is characterized by bone marrow failure and variable presentation of anomalies, including short stature, abnormal skin pigmentation, abnormal thumbs, malformations of the skeletal and central nervous systems, and developmental delay. Risks for leukemia and early onset solid tumors are significantly elevated. For there to be a risk of Fanconi anemia in offspring, both parents would each have to have a single pathogenic variant in BRCA2; in such a case, the risk of having an affected child is 25%. ° °SUPPORT AND RESOURCES: If Ms. Inniss is interested in BRCA-specific  information and support, there are two groups, Facing Our Risk (www.facingourrisk.com) and Bright Pink (www.brightpink.org) which some people have found useful. They provide opportunities to speak with other individuals from high-risk families. To locate genetic counselors in other cities, visit the website of the National Society of Genetic Counselors (www.nsgc.org) and search for a counselor by zip code. ° °We encouraged Ms. Faerber to remain in contact with us on an annual basis   so we can update her personal and family histories, and let her know of advances in cancer genetics that may benefit the family. Our contact number was provided. Ms. Gancarz's questions were answered to her satisfaction today, and she knows she is welcome to call anytime with additional questions.  ° °Karen P. Powell, MS, LCGC °Licensed, Certified Genetic Counselor [email or web address] °phone: [phone] ° °The patient was seen for a total of 60 minutes in face-to-face genetic counseling.  ° °

## 2019-11-20 NOTE — Progress Notes (Signed)
error 

## 2019-12-12 ENCOUNTER — Telehealth: Payer: Self-pay | Admitting: Genetic Counselor

## 2019-12-12 NOTE — Telephone Encounter (Signed)
I cld and lft Becky Taylor a vm to schedule her to be seen in the high breast clinic.

## 2020-02-16 ENCOUNTER — Ambulatory Visit: Payer: BC Managed Care – PPO | Attending: Internal Medicine

## 2020-02-16 DIAGNOSIS — Z23 Encounter for immunization: Secondary | ICD-10-CM | POA: Insufficient documentation

## 2020-02-16 NOTE — Progress Notes (Signed)
   Covid-19 Vaccination Clinic  Name:  Becky Taylor    MRN: [id number] DOB: 05-Mar-1971  02/16/2020  Becky Taylor was observed post Covid-19 immunization for 15 minutes without incident. She was provided with Vaccine Information Sheet and instruction to access the V-Safe system.   Becky Taylor was instructed to call 911 with any severe reactions post vaccine: Marland Kitchen Difficulty breathing  . Swelling of face and throat  . A fast heartbeat  . A bad rash all over body  . Dizziness and weakness   Immunizations Administered    Name Date Dose VIS Date Route   Pfizer COVID-19 Vaccine 02/16/2020  6:26 PM 0.3 mL 11/24/2019 Intramuscular   Manufacturer: [place]   Lot: [id number]   [place]: [id number]

## 2020-03-18 ENCOUNTER — Ambulatory Visit: Payer: BC Managed Care – PPO

## 2020-03-20 ENCOUNTER — Ambulatory Visit: Payer: BC Managed Care – PPO | Attending: Internal Medicine

## 2020-03-20 DIAGNOSIS — Z23 Encounter for immunization: Secondary | ICD-10-CM

## 2020-03-20 NOTE — Progress Notes (Signed)
   Covid-19 Vaccination Clinic  Name:  Becky Taylor    MRN: [id number] DOB: 07/12/1971  03/20/2020  Ms. Becky Taylor was observed post Covid-19 immunization for 15 minutes without incident. She was provided with Vaccine Information Sheet and instruction to access the V-Safe system.   Ms. Becky Taylor was instructed to call 911 with any severe reactions post vaccine: Marland Kitchen Difficulty breathing  . Swelling of face and throat  . A fast heartbeat  . A bad rash all over body  . Dizziness and weakness   Immunizations Administered    Name Date Dose VIS Date Route   Pfizer COVID-19 Vaccine 03/20/2020  8:21 AM 0.3 mL 11/24/2019 Intramuscular   Manufacturer: [place]   Lot: [id number]   [place]: [id number]

## 2020-07-02 ENCOUNTER — Telehealth (INDEPENDENT_AMBULATORY_CARE_PROVIDER_SITE_OTHER): Payer: BC Managed Care – PPO | Admitting: Family Medicine

## 2020-07-02 ENCOUNTER — Encounter: Payer: Self-pay | Admitting: Family Medicine

## 2020-07-02 DIAGNOSIS — Z803 Family history of malignant neoplasm of breast: Secondary | ICD-10-CM

## 2020-07-02 DIAGNOSIS — Z1501 Genetic susceptibility to malignant neoplasm of breast: Secondary | ICD-10-CM

## 2020-07-02 DIAGNOSIS — J301 Allergic rhinitis due to pollen: Secondary | ICD-10-CM | POA: Diagnosis not present

## 2020-07-02 DIAGNOSIS — J0100 Acute maxillary sinusitis, unspecified: Secondary | ICD-10-CM | POA: Diagnosis not present

## 2020-07-02 DIAGNOSIS — Z1509 Genetic susceptibility to other malignant neoplasm: Secondary | ICD-10-CM

## 2020-07-02 MED ORDER — FLUTICASONE PROPIONATE 50 MCG/ACT NA SUSP: 1.0000 | Freq: Every day | NASAL | 6 refills | Status: DC

## 2020-07-02 MED ORDER — AMOXICILLIN-POT CLAVULANATE 875-125 MG PO TABS: 1.0000 | ORAL_TABLET | Freq: Two times a day (BID) | ORAL | 0 refills | Status: DC

## 2020-07-02 NOTE — Progress Notes (Signed)
Virtual Visit via Video Note  Subjective  CC:  Chief Complaint  Patient presents with  . Sinusitis    vertigo x last 2 mornings, left ear has been stoppped up, feeling very fatigued these last couple of weeks.     I connected with Becky Taylor on 07/02/20 at 11:00 AM EDT by a video enabled telemedicine application and verified that I am speaking with the correct person using two identifiers. Location patient: Home Location provider: [facility], Office Persons participating in the virtual visit: Becky Taylor, Leamon Arnt, MD Becky Taylor CMA  I discussed the limitations of evaluation and management by telemedicine and the availability of in person appointments. The patient expressed understanding and agreed to proceed. HPI: Becky Taylor is a 49 y.o. female who was contacted today to address the problems listed above in the chief complaint. . 49 yo with increase in PND, fatigue, congestion, pressure on left face, ear fullness and vertigo. No fever but + malaise. Takes allergy pill daily. No SOB,cough or gi sxs. She has been vaccinated against covid. No sick contacts . Reviewed genetic consult note and tests and recommendations;pt hasn't yet f/u'd on recommendations but feels more clear on dx and prognosis of + BRCA genetic testing.   Assessment  1. Acute non-recurrent maxillary sinusitis   2. Seasonal allergic rhinitis due to pollen   3. BRCA2 gene mutation positive   4. Family history of breast cancer      Plan   Allergies +/- sinus infection:  Given malaise and facial pressure/pain: cover with abx. Add flonase and advil prn. F/u if unimproved.   Counseling and education regarding brca 2 gene positive and need for consideration of High risk increased breast can surveillance vs bilateral mastectomy and rec regarding bilateral salpingo-oopherectomy. Pt still thinking about it. Has been referred to High risk breast  cancer clinic and will make f/u with Dr. Terri Piedra regarding ovarian cancer risk. I discussed the assessment and treatment plan with the patient. The patient was provided an opportunity to ask questions and all were answered. The patient agreed with the plan and demonstrated an understanding of the instructions.   The patient was advised to call back or seek an in-person evaluation if the symptoms worsen or if the condition fails to improve as anticipated. Follow up: for cpe  11/05/2020  No orders of the defined types were placed in this encounter.     I reviewed the patients updated PMH, FH, and SocHx.    Patient Active Problem List   Diagnosis Date Noted  . Family history of breast cancer   . BRCA2 gene mutation positive   . Essential tremor 10/24/2019  . Obesity (BMI 30-39.9) 09/20/2019  . BRCA gene mutation positive 09/20/2019  . Family history of breast cancer in mother 09/20/2019  . Menorrhagia 11/09/2017  . PVC (premature ventricular contraction) 10/15/2011   No outpatient medications have been marked as taking for the 07/02/20 encounter (Video Visit) with Leamon Arnt, MD.    Allergies: Patient is allergic to other. Family History: Patient family history includes Arthritis in her paternal grandmother; Asthma in her paternal grandmother; BRCA 1/2 in her mother and sister; Breast cancer (age of onset: 38) in her mother; Cancer in her maternal grandfather and paternal grandfather; Healthy in her sister; Hearing loss in her paternal grandfather and paternal grandmother; Heart attack in her maternal grandfather; Heart disease in her maternal grandfather; High Cholesterol in her  maternal grandmother; Hyperlipidemia in her mother; Hypertension in her mother; Neurologic Disorder in her father; Stroke in her maternal grandmother. Social History:  Patient  reports that she has never smoked. She has never used smokeless tobacco. She reports current alcohol use. She reports that she does  not use drugs.  Review of Systems: Constitutional: Negative for fever malaise or anorexia Cardiovascular: negative for chest pain Respiratory: negative for SOB or persistent cough Gastrointestinal: negative for abdominal pain  OBJECTIVE Vitals: There were no vitals taken for this visit. General: no acute distress , A&Ox3, nontoxic appearing  Leamon Arnt, MD

## 2020-07-02 NOTE — Addendum Note (Signed)
Addended by: Billey Chang on: 07/02/2020 11:23 AM   Modules accepted: Orders

## 2020-11-05 ENCOUNTER — Encounter: Payer: BC Managed Care – PPO | Admitting: Family Medicine

## 2020-11-29 ENCOUNTER — Ambulatory Visit: Payer: BC Managed Care – PPO

## 2021-02-20 DIAGNOSIS — Z8481 Family history of carrier of genetic disease: Secondary | ICD-10-CM | POA: Insufficient documentation

## 2021-03-10 ENCOUNTER — Other Ambulatory Visit: Payer: Self-pay

## 2021-03-10 ENCOUNTER — Encounter: Payer: Self-pay | Admitting: Family Medicine

## 2021-03-10 ENCOUNTER — Ambulatory Visit (INDEPENDENT_AMBULATORY_CARE_PROVIDER_SITE_OTHER): Payer: BC Managed Care – PPO | Admitting: Family Medicine

## 2021-03-10 VITALS — BP 142/90 | HR 63 | Temp 97.8°F | Resp 16 | Ht 68.0 in | Wt 221.4 lb

## 2021-03-10 DIAGNOSIS — G250 Essential tremor: Secondary | ICD-10-CM | POA: Diagnosis not present

## 2021-03-10 DIAGNOSIS — Z1212 Encounter for screening for malignant neoplasm of rectum: Secondary | ICD-10-CM | POA: Diagnosis not present

## 2021-03-10 DIAGNOSIS — E669 Obesity, unspecified: Secondary | ICD-10-CM | POA: Diagnosis not present

## 2021-03-10 DIAGNOSIS — Z0000 Encounter for general adult medical examination without abnormal findings: Secondary | ICD-10-CM | POA: Diagnosis not present

## 2021-03-10 DIAGNOSIS — Z1211 Encounter for screening for malignant neoplasm of colon: Secondary | ICD-10-CM

## 2021-03-10 DIAGNOSIS — Z803 Family history of malignant neoplasm of breast: Secondary | ICD-10-CM | POA: Diagnosis not present

## 2021-03-10 DIAGNOSIS — Z23 Encounter for immunization: Secondary | ICD-10-CM

## 2021-03-10 DIAGNOSIS — Z1509 Genetic susceptibility to other malignant neoplasm: Secondary | ICD-10-CM

## 2021-03-10 DIAGNOSIS — M545 Low back pain, unspecified: Secondary | ICD-10-CM | POA: Diagnosis not present

## 2021-03-10 DIAGNOSIS — Z1501 Genetic susceptibility to malignant neoplasm of breast: Secondary | ICD-10-CM | POA: Diagnosis not present

## 2021-03-10 DIAGNOSIS — G8929 Other chronic pain: Secondary | ICD-10-CM

## 2021-03-10 LAB — COMPREHENSIVE METABOLIC PANEL
ALT: 9 U/L (ref 0–35)
AST: 12 U/L (ref 0–37)
Albumin: 4.3 g/dL (ref 3.5–5.2)
Alkaline Phosphatase: 45 U/L (ref 39–117)
BUN: 9 mg/dL (ref 6–23)
CO2: 25 mEq/L (ref 19–32)
Calcium: 9 mg/dL (ref 8.4–10.5)
Chloride: 105 mEq/L (ref 96–112)
Creatinine, Ser: 0.67 mg/dL (ref 0.40–1.20)
GFR: 102.16 mL/min (ref >60.00)
Glucose, Bld: 72 mg/dL (ref 70–99)
Potassium: 3.7 mEq/L (ref 3.5–5.1)
Sodium: 138 mEq/L (ref 135–145)
Total Bilirubin: 0.5 mg/dL (ref 0.2–1.2)
Total Protein: 6.9 g/dL (ref 6.0–8.3)

## 2021-03-10 LAB — CBC WITH DIFFERENTIAL/PLATELET
Basophils Absolute: 0.1 10*3/uL (ref 0.0–0.1)
Basophils Relative: 1.1 % (ref 0.0–3.0)
Eosinophils Absolute: 0.3 10*3/uL (ref 0.0–0.7)
Eosinophils Relative: 5.4 % — ABNORMAL HIGH (ref 0.0–5.0)
HCT: 41.3 % (ref 36.0–46.0)
Hemoglobin: 14.1 g/dL (ref 12.0–15.0)
Lymphocytes Relative: 25.5 % (ref 12.0–46.0)
Lymphs Abs: 1.5 10*3/uL (ref 0.7–4.0)
MCHC: 34.1 g/dL (ref 30.0–36.0)
MCV: 88 fl (ref 78.0–100.0)
Monocytes Absolute: 0.4 10*3/uL (ref 0.1–1.0)
Monocytes Relative: 6.8 % (ref 3.0–12.0)
Neutro Abs: 3.6 10*3/uL (ref 1.4–7.7)
Neutrophils Relative %: 61.2 % (ref 43.0–77.0)
Platelets: 232 10*3/uL (ref 150.0–400.0)
RBC: 4.69 Mil/uL (ref 3.87–5.11)
RDW: 13.6 % (ref 11.5–15.5)
WBC: 5.9 10*3/uL (ref 4.0–10.5)

## 2021-03-10 LAB — LIPID PANEL
Cholesterol: 184 mg/dL (ref 0–200)
HDL: 50.7 mg/dL (ref >39.00)
LDL Cholesterol: 119 mg/dL — ABNORMAL HIGH (ref 0–99)
NonHDL: 132.92
Total CHOL/HDL Ratio: 4
Triglycerides: 71 mg/dL (ref 0.0–149.0)
VLDL: 14.2 mg/dL (ref 0.0–40.0)

## 2021-03-10 NOTE — Addendum Note (Signed)
Addended by: Doran Clay A on: 03/10/2021 11:17 AM   Modules accepted: Orders

## 2021-03-10 NOTE — Progress Notes (Signed)
Subjective  Chief Complaint  Patient presents with  . Annual Exam    Non-fasting   . Tailbone Pain    On going for a year, denies any injuries.     HPI: Becky Taylor is a 50 y.o. female who presents to [facility] today for a Female Wellness Visit. She also has the concerns and/or needs as listed above in the chief complaint. These will be addressed in addition to the Health Maintenance Visit.   Wellness Visit: annual visit with health maintenance review and exam without Pap   Health maintenance: Sees GYN next month.  She is overdue for mammogram.  She will have a GYN office.  Pap smear is up-to-date.  Physically, feeling well.  Continues to diet and exercise although she is not been able to lose more weight.  She has been more a maintenance phase.  Has questions about weight loss.  She has been jogging for exercise.  No strength training.  Uses weight watchers for diet control.  Has lost over 80 pounds in the last 2 years.  She is eligible for Shingrix and will take it today.  Colon cancer screening options discussed Chronic disease f/u and/or acute problem visit: (deemed necessary to be done in addition to the wellness visit):  Essential tremor: Family history of super nuclear palsy.  Denies worsening tremor.  BRCA positive with family history of breast cancer: Needs mammogram.  Considering nephrectomy.  Has not made any decisions regarding breast cancer screening since high risk for prophylactic surgeries.  She will talk with GYN at next visit.  Complains of low-grade back pain worse after prolonged sitting.  Hurts when she stands.  No radicular symptoms.  No injuries.Does not take any medications.  Assessment  1. Annual physical exam   2. Essential tremor   3. BRCA gene mutation positive   4. Family history of breast cancer   5. Screening for colorectal cancer   6. Chronic midline low back pain without sciatica   7. Obesity (BMI 30-39.9)       Plan  Female Wellness Visit:  Age appropriate Health Maintenance and Prevention measures were discussed with patient. Included topics are cancer screening recommendations, ways to keep healthy (see AVS) including dietary and exercise recommendations, regular eye and dental care, use of seat belts, and avoidance of moderate alcohol use and tobacco use.  Mammogram at GYN office.  Referred to GI for colonoscopy  BMI: discussed patient's BMI and encouraged positive lifestyle modifications to help get to or maintain a target BMI.  HM needs and immunizations were addressed and ordered. See below for orders. See HM and immunization section for updates.  First Shingrix today, repeat in 6 months  Routine labs and screening tests ordered including cmp, cbc and lipids where appropriate.  Discussed recommendations regarding Vit D and calcium supplementation (see AVS)  Chronic disease management visit and/or acute problem visit:  Tremor: Monitor, reassured  BRCA positive: To discuss with GYN preventive measures.  Obesity: Discussed weight loss medications.  Given strong family history of cancer would avoid GLP-1's unless needed.  Recommend changing diet and increasing strength training.  Follow-up if needed.  Low back pain: Educated, recommend low back exercises, core strengthening and as needed Advil or Tylenol.   Follow up: 12 months for your complete annual physical exam with blood work. Please come fasting. Orders Placed This Encounter  Procedures  . CBC with Differential/Platelet  . Comprehensive metabolic panel  . Lipid panel  .  Ambulatory referral to Gastroenterology   No orders of the defined types were placed in this encounter.     Body mass index is 33.66 kg/m. Wt Readings from Last 3 Encounters:  03/10/21 221 lb 6.4 oz (100.4 kg)  10/24/19 247 lb (112 kg)  09/20/19 255 lb 6.4 oz (115.8 kg)     Patient Active Problem List   Diagnosis Date Noted  . Family history of  breast cancer   . BRCA2 gene mutation positive   . Essential tremor 10/24/2019  . Obesity (BMI 30-39.9) 09/20/2019  . BRCA gene mutation positive 09/20/2019  . Family history of breast cancer in mother 09/20/2019  . Menorrhagia 11/09/2017  . PVC (premature ventricular contraction) 10/15/2011   Health Maintenance  Topic Date Due  . Hepatitis C Screening  Never done  . COLONOSCOPY (Pts 45-32yr Insurance coverage will need to be confirmed)  Never done  . MAMMOGRAM  07/16/2020  . PAP SMEAR-Modifier  07/16/2022  . TETANUS/TDAP  10/23/2029  . INFLUENZA VACCINE  Completed  . COVID-19 Vaccine  Completed  . HIV Screening  Completed  . HPV VACCINES  Aged Out   Immunization History  Administered Date(s) Administered  . Influenza Inj Mdck Quad Pf 11/07/2019  . Influenza,inj,Quad PF,6-35 Mos 09/13/2020  . PFIZER(Purple Top)SARS-COV-2 Vaccination 02/16/2020, 03/20/2020, 12/04/2020  . Tdap 10/24/2019   We updated and reviewed the patient's past history in detail and it is documented below. Allergies: Patient is allergic to other. Past Medical History Patient  has a past medical history of BRCA2 gene mutation positive, Eosinophilic esophagitis, Family history of breast cancer, Obesity, and Palpitations. Past Surgical History Patient  has a past surgical history that includes Adenoidectomy. Family History: Patient family history includes Arthritis in her paternal grandmother; Asthma in her paternal grandmother; BRCA 1/2 in her mother and sister; Breast cancer (age of onset: 674 in her mother; Cancer in her maternal grandfather and paternal grandfather; Healthy in her sister; Hearing loss in her paternal grandfather and paternal grandmother; Heart attack in her maternal grandfather; Heart disease in her maternal grandfather; High Cholesterol in her maternal grandmother; Hyperlipidemia in her mother; Hypertension in her mother; Neurologic Disorder in her father; Stroke in her maternal  grandmother. Social History:  Patient  reports that she has never smoked. She has never used smokeless tobacco. She reports current alcohol use. She reports that she does not use drugs.  Review of Systems: Constitutional: negative for fever or malaise Ophthalmic: negative for photophobia, double vision or loss of vision Cardiovascular: negative for chest pain, dyspnea on exertion, or new LE swelling Respiratory: negative for SOB or persistent cough Gastrointestinal: negative for abdominal pain, change in bowel habits or melena Genitourinary: negative for dysuria or gross hematuria, no abnormal uterine bleeding or disharge Musculoskeletal: negative for new gait disturbance or muscular weakness Integumentary: negative for new or persistent rashes, no breast lumps Neurological: negative for TIA or stroke symptoms Psychiatric: negative for SI or delusions Allergic/Immunologic: negative for hives  Patient Care Team    Relationship Specialty Notifications Start End  ALeamon Arnt MD PCP - General Family Medicine  09/20/19   BSherlyn Hay DO Consulting Physician Obstetrics and Gynecology  09/20/19     Objective  Vitals: BP (!) 142/90   Pulse 63   Temp 97.8 F (36.6 C) (Temporal)   Resp 16   Ht _0  (1.727 m)   Wt 221 lb 6.4 oz (100.4 kg)   SpO2 98%   BMI 33.66 kg/m  General:  Well developed, well nourished, no acute distress  Psych:  Alert and orientedx3,normal mood and affect HEENT:  Normocephalic, atraumatic, non-icteric sclera,  supple neck without adenopathy, mass or thyromegaly Cardiovascular:  Normal S1, S2, RRR without gallop, rub or murmur Respiratory:  Good breath sounds bilaterally, CTAB with normal respiratory effort Gastrointestinal: normal bowel sounds, soft, non-tender, no noted masses. No HSM MSK: no deformities, contusions. Joints are without erythema or swelling.  Skin:  Warm, no rashes or suspicious lesions noted Neurologic:    Mental status is normal.  CN 2-11 are normal. Gross motor and sensory exams are normal. Normal gait.  Mild tremor present    Commons side effects, risks, benefits, and alternatives for medications and treatment plan prescribed today were discussed, and the patient expressed understanding of the given instructions. Patient is instructed to call or message via MyChart if he/she has any questions or concerns regarding our treatment plan. No barriers to understanding were identified. We discussed Red Flag symptoms and signs in detail. Patient expressed understanding regarding what to do in case of urgent or emergency type symptoms.   Medication list was reconciled, printed and provided to the patient in AVS. Patient instructions and summary information was reviewed with the patient as documented in the AVS. This note was prepared with assistance of Dragon voice recognition software. Occasional wrong-word or sound-a-like substitutions may have occurred due to the inherent limitations of voice recognition software  This visit occurred during the SARS-CoV-2 public health emergency.  Safety protocols were in place, including screening questions prior to the visit, additional usage of staff PPE, and extensive cleaning of exam room while observing appropriate contact time as indicated for disinfecting solutions.

## 2021-03-10 NOTE — Patient Instructions (Signed)
Please return in 12 months for your annual complete physical; please come fasting. Please schedule a nurse visit in 2-6 months for your 2nd Shingrix vaccination. You were given your first today.  I will release your lab results to you on your MyChart account with further instructions. Please reply with any questions.   We will call you with information regarding your referral appointment. [facility] to get you set up for colon cancer screening colonoscopy.  If you do not hear from [place] within the next 2 weeks, please let me know. It can take 1-2 weeks to get appointments set up with the specialists.   If you have any questions or concerns, please don't hesitate to send me a message via MyChart or call the office at [phone]. Thank you for visiting with [place] today! It's our pleasure caring for you.   Back Exercises The following exercises strengthen the muscles that help to support the trunk and back. They also help to keep the lower back flexible. Doing these exercises can help to prevent back pain or lessen existing pain.  If you have back pain or discomfort, try doing these exercises 2-3 times each day or as told by your health care provider.  As your pain improves, do them once each day, but increase the number of times that you repeat the steps for each exercise (do more repetitions).  To prevent the recurrence of back pain, continue to do these exercises once each day or as told by your health care provider. Do exercises exactly as told by your health care provider and adjust them as directed. It is normal to feel mild stretching, pulling, tightness, or discomfort as you do these exercises, but you should stop right away if you feel sudden pain or your pain gets worse. Exercises Single knee to chest Repeat these steps 3-5 times for each leg: 1. Lie on your back on a firm bed or the floor with your legs extended. 2. Bring one knee to your chest. Your other leg should stay extended and in  contact with the floor. 3. Hold your knee in place by grabbing your knee or thigh with both hands and hold. 4. Pull on your knee until you feel a gentle stretch in your lower back or buttocks. 5. Hold the stretch for 10-30 seconds. 6. Slowly release and straighten your leg. Pelvic tilt Repeat these steps 5-10 times: 1. Lie on your back on a firm bed or the floor with your legs extended. 2. Bend your knees so they are pointing toward the ceiling and your feet are flat on the floor. 3. Tighten your lower abdominal muscles to press your lower back against the floor. This motion will tilt your pelvis so your tailbone points up toward the ceiling instead of pointing to your feet or the floor. 4. With gentle tension and even breathing, hold this position for 5-10 seconds. Cat-cow Repeat these steps until your lower back becomes more flexible: 1. Get into a hands-and-knees position on a firm surface. Keep your hands under your shoulders, and keep your knees under your hips. You may place padding under your knees for comfort. 2. Let your head hang down toward your chest. Contract your abdominal muscles and point your tailbone toward the floor so your lower back becomes rounded like the back of a cat. 3. Hold this position for 5 seconds. 4. Slowly lift your head, let your abdominal muscles relax and point your tailbone up toward the ceiling so your back forms  a sagging arch like the back of a cow. 5. Hold this position for 5 seconds.   Press-ups Repeat these steps 5-10 times: 1. Lie on your abdomen (face-down) on the floor. 2. Place your palms near your head, about shoulder-width apart. 3. Keeping your back as relaxed as possible and keeping your hips on the floor, slowly straighten your arms to raise the top half of your body and lift your shoulders. Do not use your back muscles to raise your upper torso. You may adjust the placement of your hands to make yourself more comfortable. 4. Hold this  position for 5 seconds while you keep your back relaxed. 5. Slowly return to lying flat on the floor.   Bridges Repeat these steps 10 times: 1. Lie on your back on a firm surface. 2. Bend your knees so they are pointing toward the ceiling and your feet are flat on the floor. Your arms should be flat at your sides, next to your body. 3. Tighten your buttocks muscles and lift your buttocks off the floor until your waist is at almost the same height as your knees. You should feel the muscles working in your buttocks and the back of your thighs. If you do not feel these muscles, slide your feet 1-2 inches farther away from your buttocks. 4. Hold this position for 3-5 seconds. 5. Slowly lower your hips to the starting position, and allow your buttocks muscles to relax completely. If this exercise is too easy, try doing it with your arms crossed over your chest.   Abdominal crunches Repeat these steps 5-10 times: 1. Lie on your back on a firm bed or the floor with your legs extended. 2. Bend your knees so they are pointing toward the ceiling and your feet are flat on the floor. 3. Cross your arms over your chest. 4. Tip your chin slightly toward your chest without bending your neck. 5. Tighten your abdominal muscles and slowly raise your trunk (torso) high enough to lift your shoulder blades a tiny bit off the floor. Avoid raising your torso higher than that because it can put too much stress on your low back and does not help to strengthen your abdominal muscles. 6. Slowly return to your starting position. Back lifts Repeat these steps 5-10 times: 1. Lie on your abdomen (face-down) with your arms at your sides, and rest your forehead on the floor. 2. Tighten the muscles in your legs and your buttocks. 3. Slowly lift your chest off the floor while you keep your hips pressed to the floor. Keep the back of your head in line with the curve in your back. Your eyes should be looking at the floor. 4. Hold  this position for 3-5 seconds. 5. Slowly return to your starting position. Contact a health care provider if:  Your back pain or discomfort gets much worse when you do an exercise.  Your worsening back pain or discomfort does not lessen within 2 hours after you exercise. If you have any of these problems, stop doing these exercises right away. Do not do them again unless your health care provider says that you can. Get help right away if:  You develop sudden, severe back pain. If this happens, stop doing the exercises right away. Do not do them again unless your health care provider says that you can. This information is not intended to replace advice given to you by your health care provider. Make sure you discuss any questions you have with your  health care provider. Document Revised: 04/06/2019 Document Reviewed: 09/01/2018 Elsevier Patient Education  [place].

## 2021-08-21 ENCOUNTER — Ambulatory Visit: Payer: BC Managed Care – PPO

## 2021-09-10 ENCOUNTER — Encounter: Payer: Self-pay | Admitting: Family Medicine

## 2021-09-11 ENCOUNTER — Encounter: Payer: Self-pay | Admitting: Gastroenterology

## 2021-09-17 ENCOUNTER — Other Ambulatory Visit: Payer: Self-pay | Admitting: Obstetrics and Gynecology

## 2021-09-24 ENCOUNTER — Other Ambulatory Visit: Payer: Self-pay | Admitting: Obstetrics and Gynecology

## 2021-09-24 DIAGNOSIS — Z803 Family history of malignant neoplasm of breast: Secondary | ICD-10-CM

## 2021-10-01 ENCOUNTER — Ambulatory Visit: Payer: BC Managed Care – PPO

## 2021-10-20 ENCOUNTER — Other Ambulatory Visit: Payer: Self-pay

## 2021-10-20 ENCOUNTER — Ambulatory Visit
Admission: RE | Admit: 2021-10-20 | Discharge: 2021-10-20 | Disposition: A | Discharge status: Home / Self Care | Payer: BC Managed Care – PPO | Source: Ambulatory Visit | Attending: Obstetrics and Gynecology | Admitting: Obstetrics and Gynecology

## 2021-10-20 DIAGNOSIS — Z803 Family history of malignant neoplasm of breast: Secondary | ICD-10-CM

## 2021-10-20 IMAGING — MR MR BREAST BILAT WO/W CM
9 of 14 series · 33 of 48 positions shown · IV contrast (10ml gadavist)
Comparison: Screening mammogram on [DATE]

CLINICAL DATA: The patient is positive for the [A1] gene
mutation. Family history of breast cancer diagnosed in her mother at
age 67. The patient's mother is also positive for the [A1] gene
mutation.

LABS:  None obtained at the time of imaging.
EXAM:
BILATERAL BREAST MRI WITH AND WITHOUT CONTRAST
TECHNIQUE: Multiplanar, multisequence MR images of both breasts were obtained
prior to and following the intravenous administration of 10 ml of
Gadavist

[Series 2: t2_tirm_tra ipat (a-p) · axial · 3.0mm · 0.78mm/px · 1 of 57 slices shown]
[im 1/57]
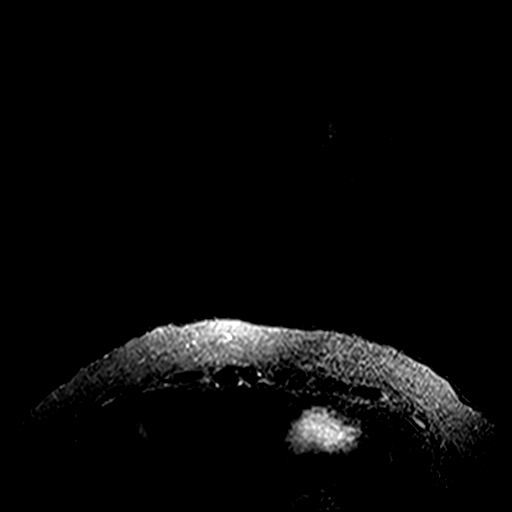

[Series 3: fl3d pre-cm no · axial · non-contrast · 1.2mm · 1.04mm/px · z∈[-62,+110]mm · 5 of 144 slices shown]
[im 1/144]
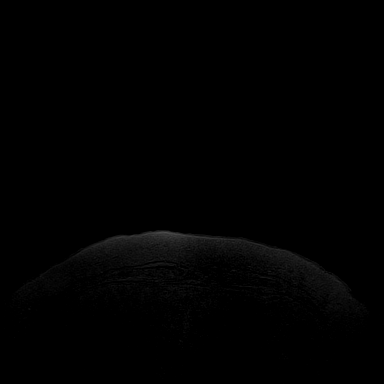
[im 36/144]
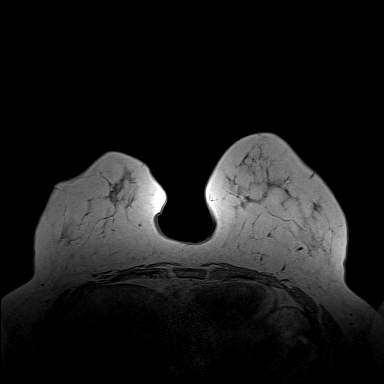
[im 72/144]
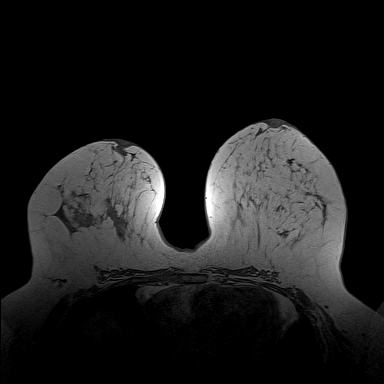
[im 108/144]
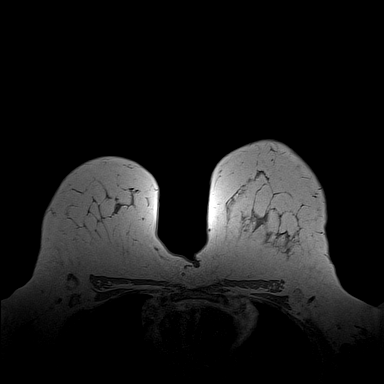
[im 144/144]
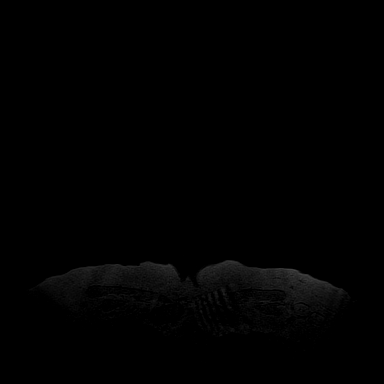

[Series 4: fl3d pre-cm · axial · non-contrast · 1.2mm · 1.04mm/px · z∈[-62,+110]mm · 5 of 144 slices shown]
[im 1/144]
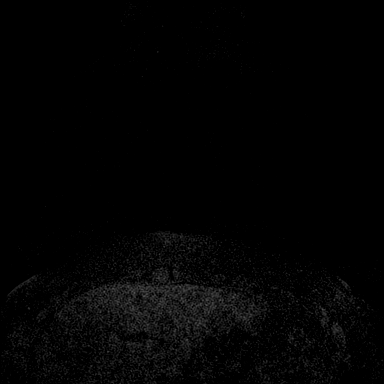
[im 36/144]
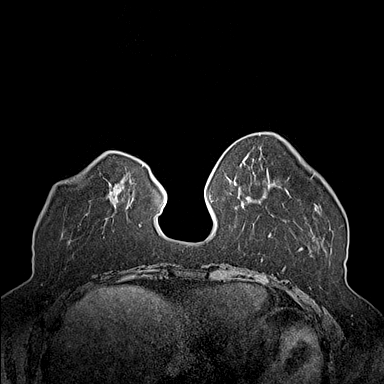
[im 72/144]
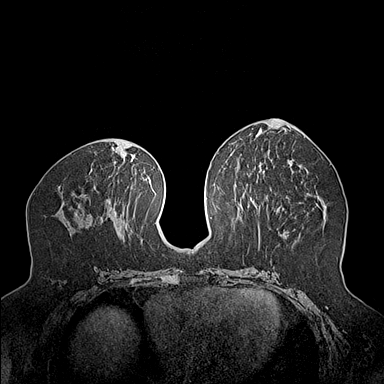
[im 108/144]
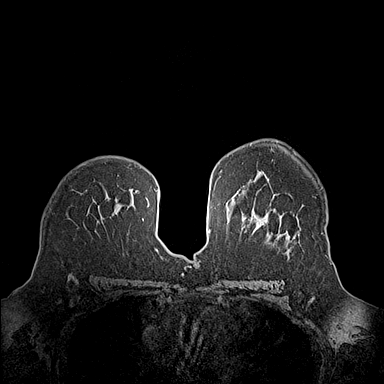
[im 144/144]
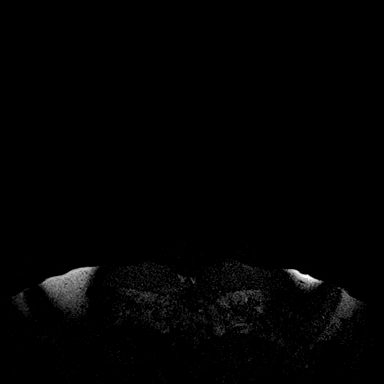

[Series 5: fl3d post-cm 20 · axial · 1.2mm · 1.04mm/px · z∈[-62,+110]mm · 5 of 144 slices shown (1 of 3)]
[im 1/144]
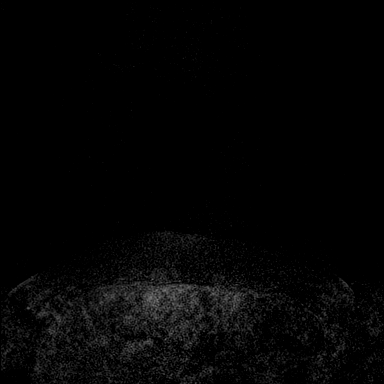
[im 36/144]
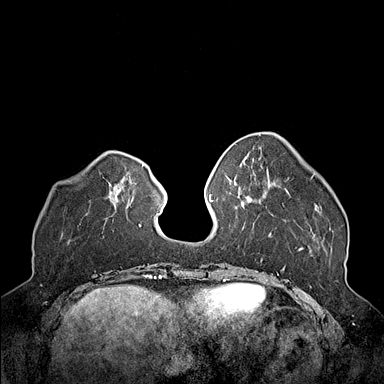
[im 72/144]
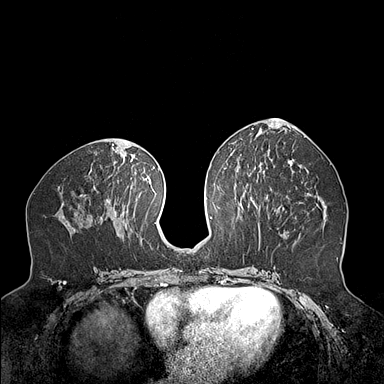
[im 108/144]
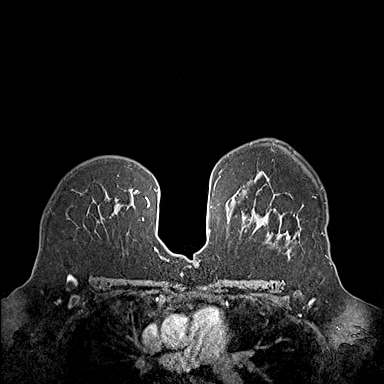
[im 144/144]
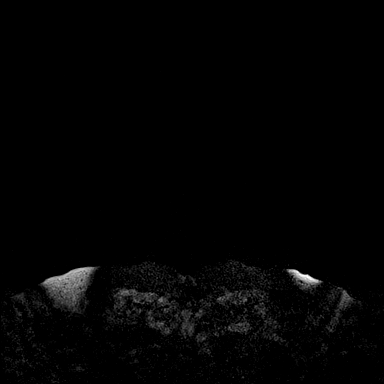

[Series 6: fl3d post-cm 20 · axial · 1.2mm · 1.04mm/px · z∈[-62,+110]mm · 5 of 144 slices shown (2 of 3)]
[im 1/144]
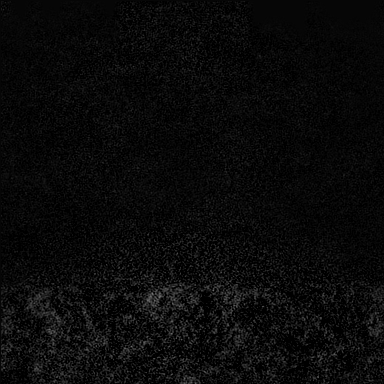
[im 36/144]
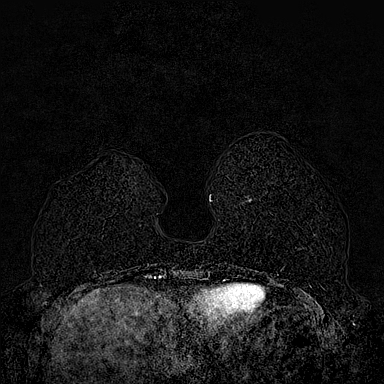
[im 72/144]
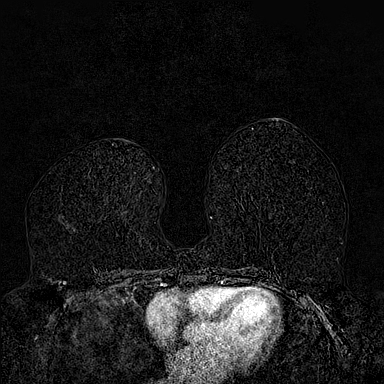
[im 108/144]
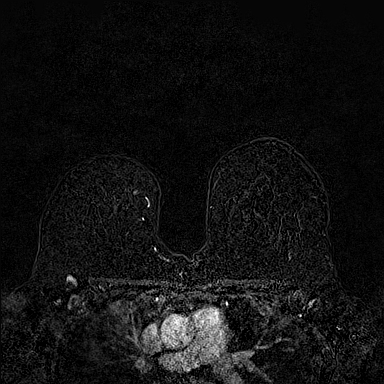
[im 144/144]
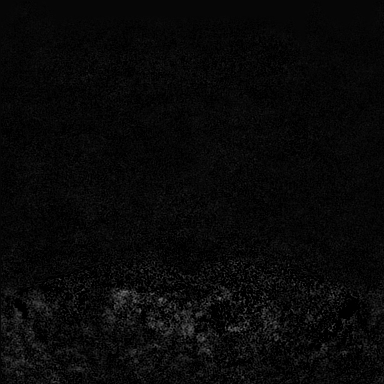

[Series 7: fl3d post-cm 20 · axial · 172.8mm · 1.04mm/px · 1 of 1 slices shown (3 of 3)]
[im 1/1]
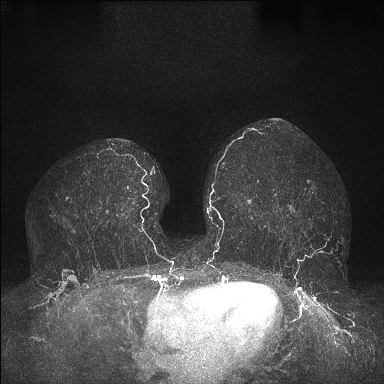

[Series 8: fl3d post-cm 3 · axial · 1.2mm · 1.04mm/px · z∈[-62,+110]mm · 5 of 144 slices shown (1 of 3)]
[im 1/144]
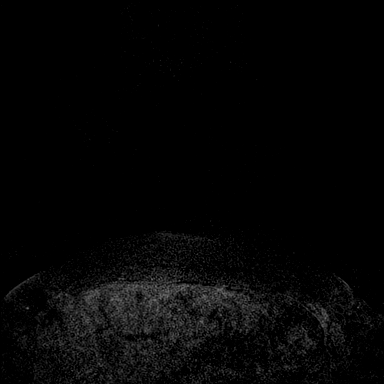
[im 36/144]
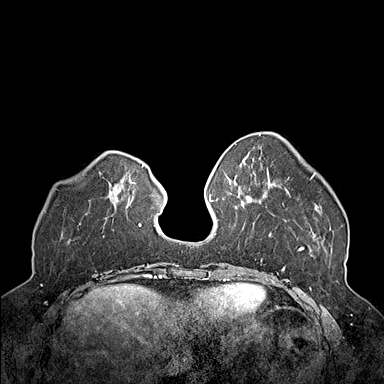
[im 72/144]
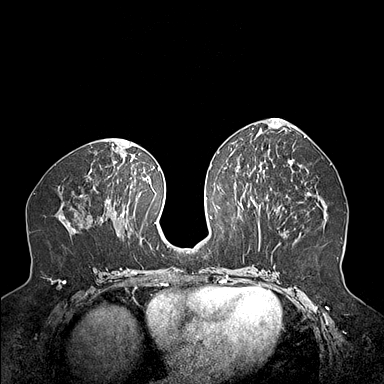
[im 108/144]
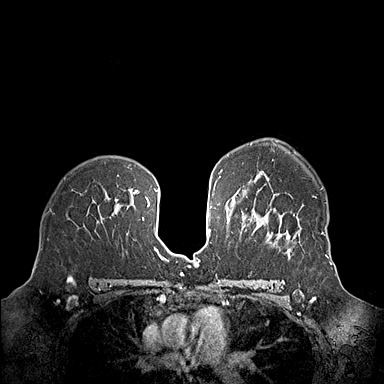
[im 144/144]
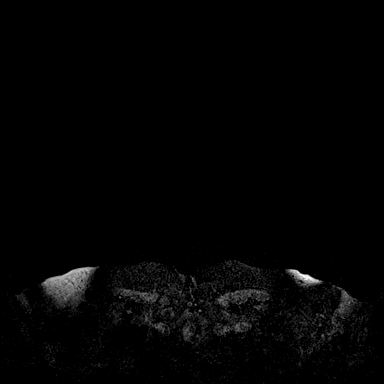

[Series 9: fl3d post-cm 3 · axial · 1.2mm · 1.04mm/px · z∈[-62,+110]mm · 5 of 144 slices shown (2 of 3)]
[im 1/144]
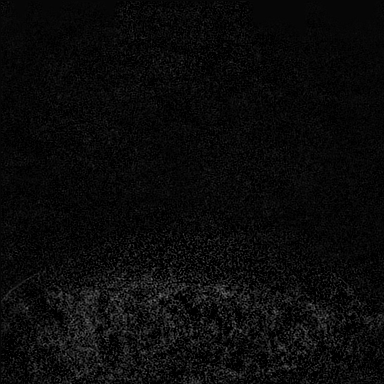
[im 36/144]
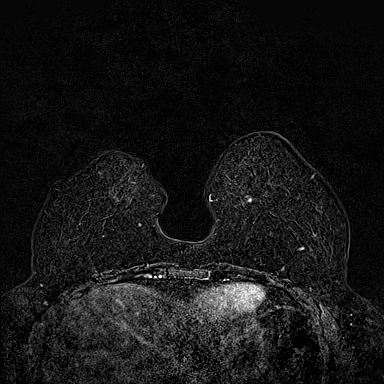
[im 72/144]
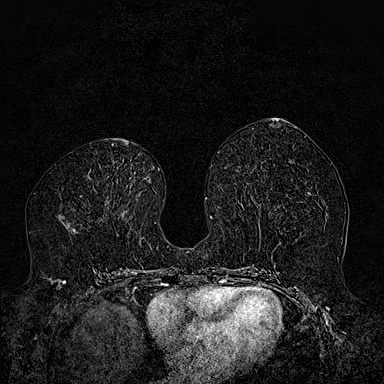
[im 108/144]
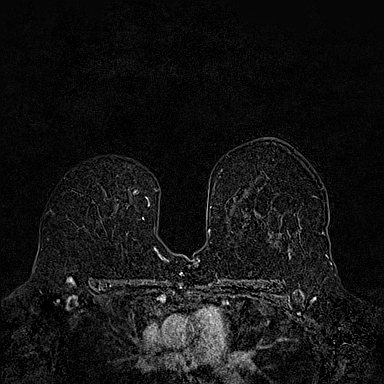
[im 144/144]
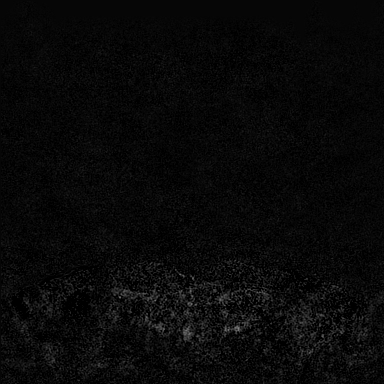

[Series 10: fl3d post-cm 3 · axial · 172.8mm · 1.04mm/px · 1 of 1 slices shown (3 of 3)]
[im 1/1]
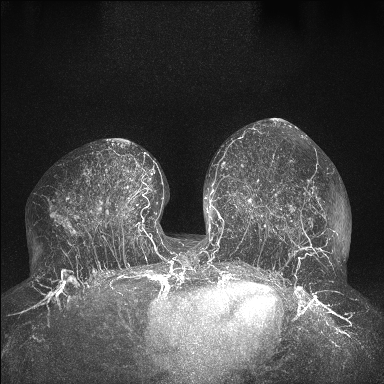

[33 of 48 positions shown; findings below may reference images not displayed]

Three-dimensional MR images were rendered by post-processing of the
original MR data on an independent workstation. The
three-dimensional MR images were interpreted, and findings are
reported in the following complete MRI report for this study. Three
dimensional images were evaluated at the independent interpreting
workstation using the DynaCAD thin client.
FINDINGS: Breast composition: c. Heterogeneous fibroglandular tissue.

Background parenchymal enhancement: Mild

Right breast: Within the LATERAL mid aspect of the RIGHT breast, at
mid to posterior depth, there is focal non mass enhancement (image
71 of series 12) this area of enhancement spans 2.6 x 2.1 x
centimeters. Enhancement shows persistent type kinetics.

Left breast: No mass or abnormal enhancement.

Lymph nodes: No abnormal appearing lymph nodes.

Ancillary findings:  None.
IMPRESSION: 1. 3.5 centimeter area of non mass enhancement within the LATERAL
mid aspect of the RIGHT breast, at mid to posterior depth.
2. LEFT breast is negative.

RECOMMENDATION:
Recommend MR guided core biopsy of the RIGHT breast.

BI-RADS CATEGORY  4: Suspicious.

## 2021-10-20 MED ORDER — GADOBUTROL 1 MMOL/ML IV SOLN
10.0000 mL | Freq: Once | INTRAVENOUS | Status: AC | PRN
  Administered 2021-10-20: 10 mL via INTRAVENOUS

## 2021-10-22 ENCOUNTER — Other Ambulatory Visit: Payer: Self-pay | Admitting: Obstetrics and Gynecology

## 2021-10-22 DIAGNOSIS — R9389 Abnormal findings on diagnostic imaging of other specified body structures: Secondary | ICD-10-CM

## 2021-10-23 ENCOUNTER — Ambulatory Visit: Payer: BC Managed Care – PPO

## 2021-10-24 ENCOUNTER — Ambulatory Visit
Admission: RE | Admit: 2021-10-24 | Discharge: 2021-10-24 | Disposition: A | Discharge status: Home / Self Care | Payer: BC Managed Care – PPO | Source: Ambulatory Visit | Attending: Obstetrics and Gynecology | Admitting: Obstetrics and Gynecology

## 2021-10-24 ENCOUNTER — Other Ambulatory Visit: Payer: Self-pay | Admitting: Diagnostic Radiology

## 2021-10-24 ENCOUNTER — Other Ambulatory Visit: Payer: Self-pay

## 2021-10-24 DIAGNOSIS — R9389 Abnormal findings on diagnostic imaging of other specified body structures: Secondary | ICD-10-CM

## 2021-10-24 HISTORY — PX: BREAST BIOPSY: SHX20

## 2021-10-24 IMAGING — MG MM BREAST LOCALIZATION CLIP
4 series · 4 of 12 positions shown · non-contrast
Comparison: Previous exam(s).

CLINICAL DATA: Status post MR guided biopsy of non mass enhancement
in the right breast.

EXAM:
3D DIAGNOSTIC RIGHT MAMMOGRAM POST MRI BIOPSY

[R ML synth-2D]
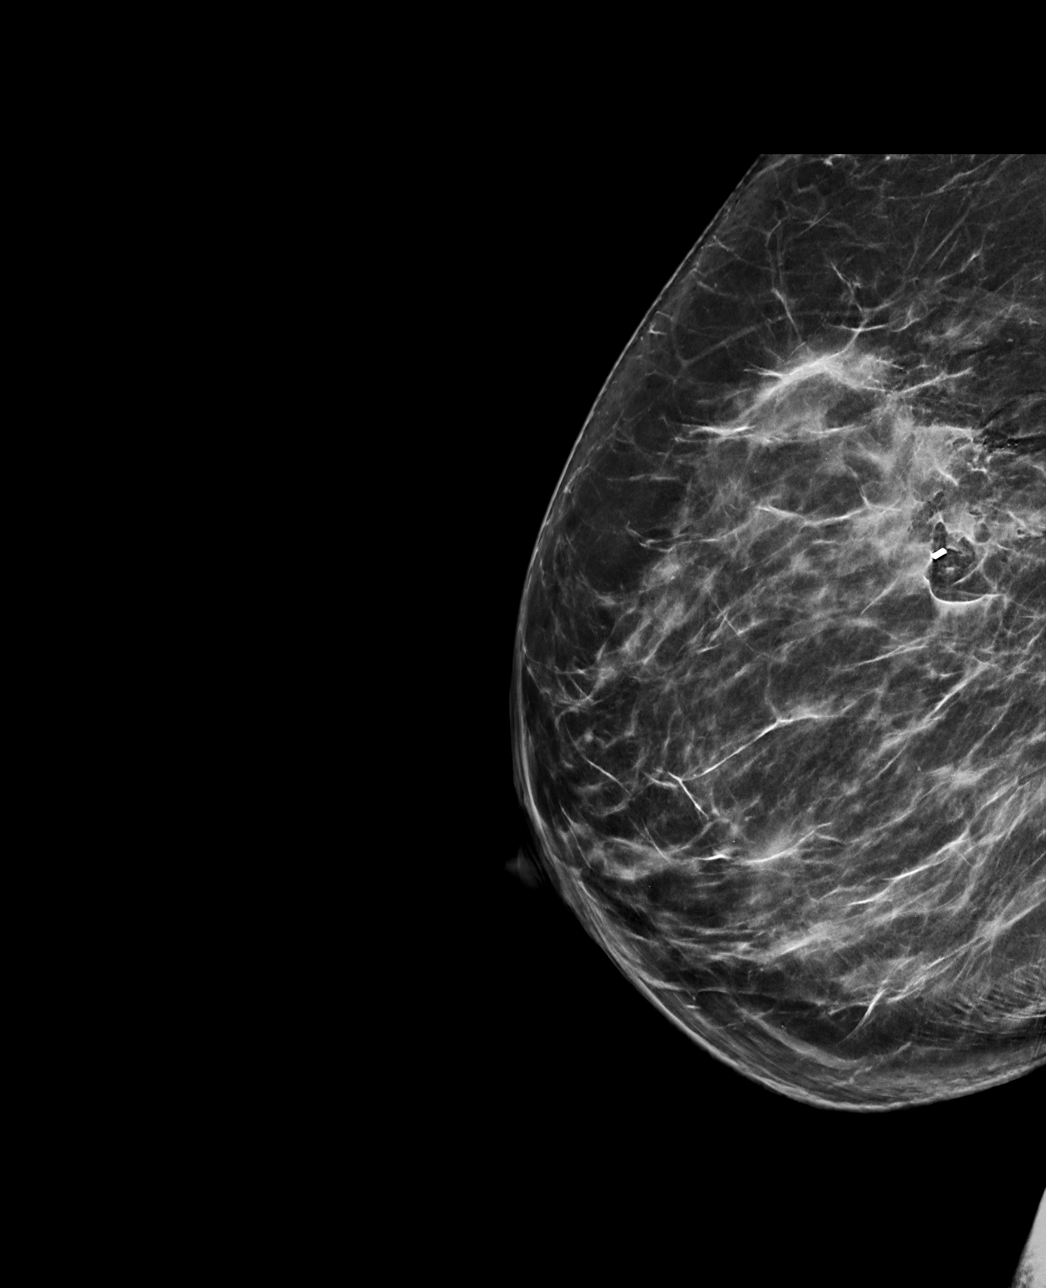

[R CC synth-2D]
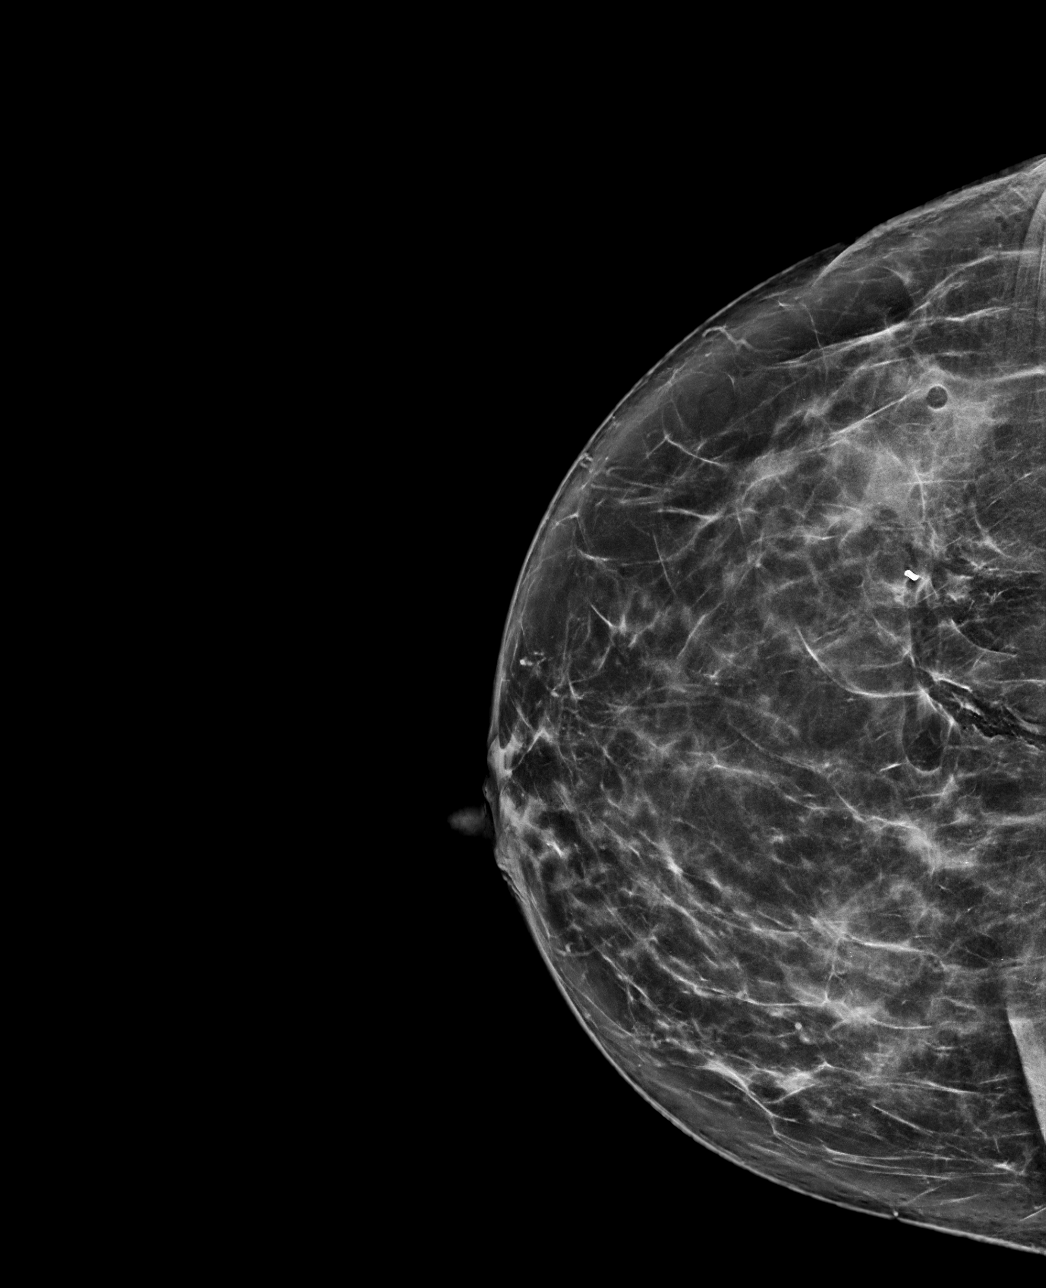

[R ML tomo · tomo slice 43/85.0]
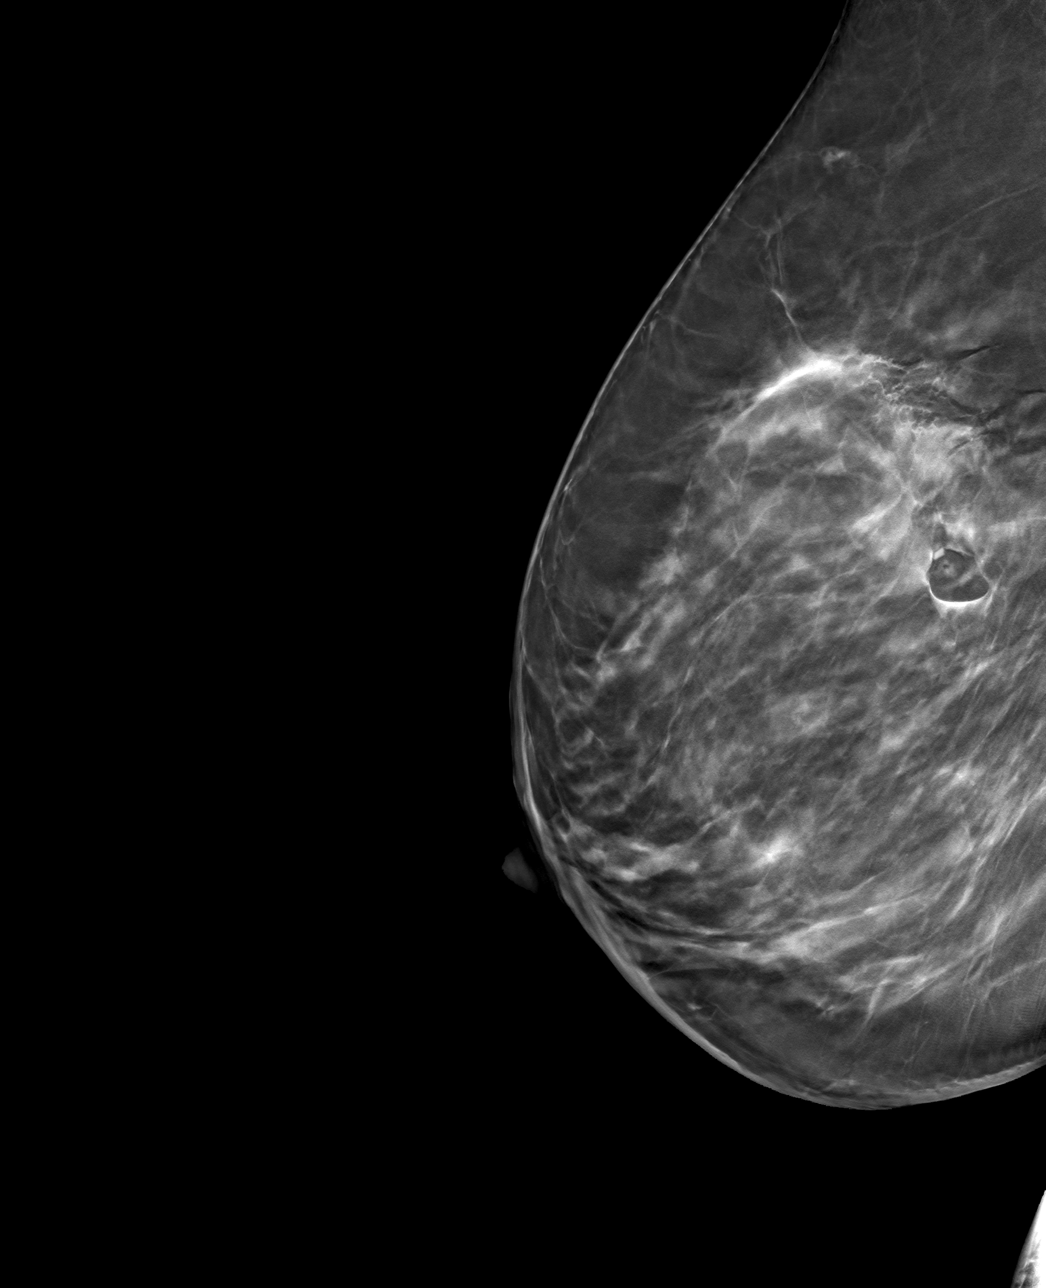

[R CC tomo · tomo slice 42/83.0]
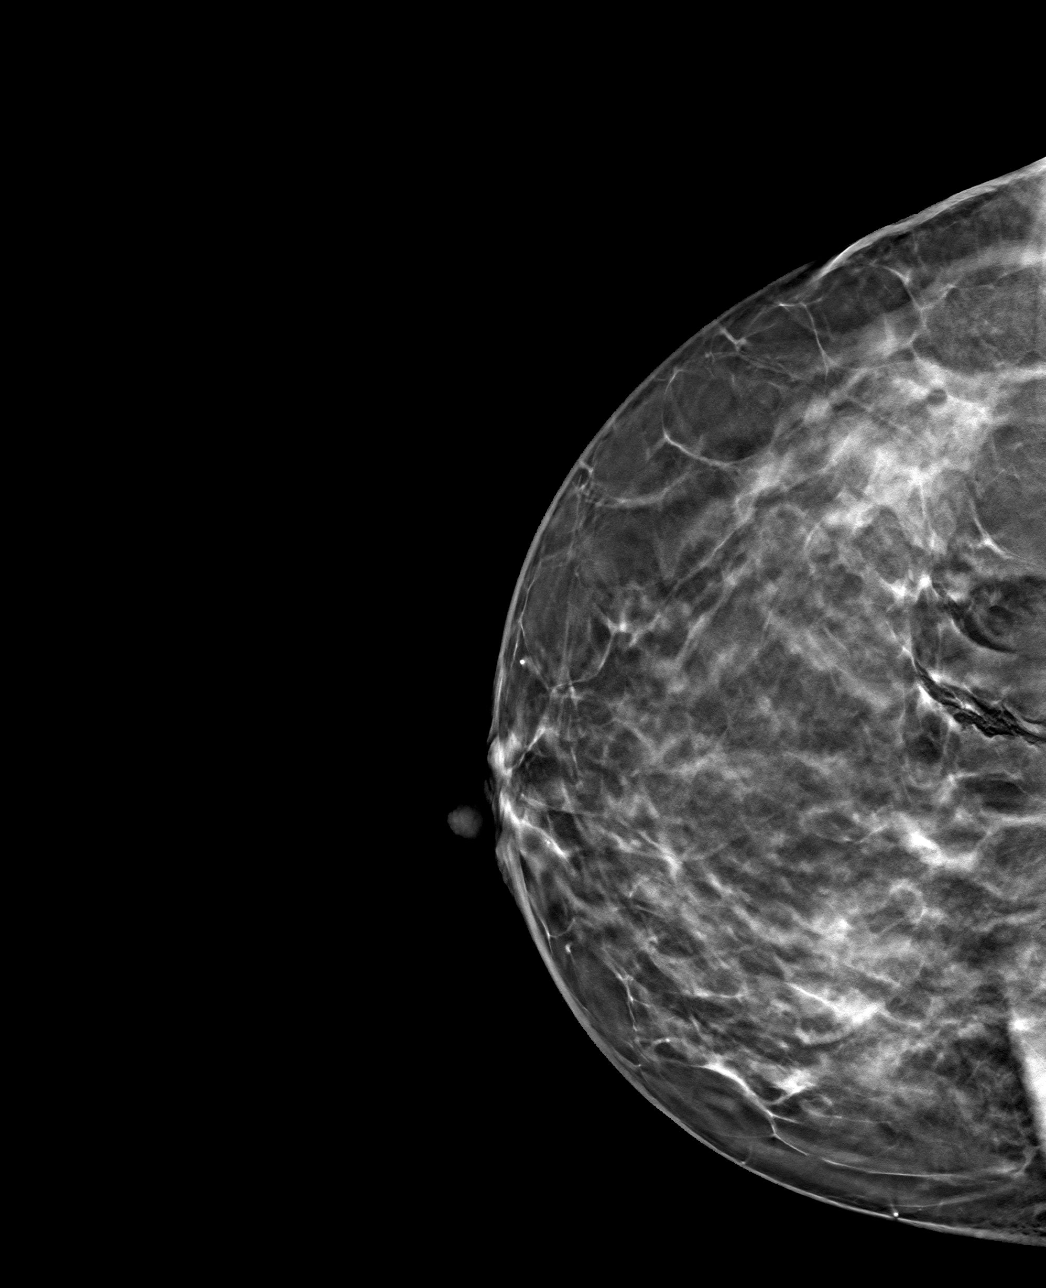

[4 of 12 positions shown; findings below may reference images not displayed]

FINDINGS: 3D Mammographic images were obtained following MRI guided biopsy of
non mass enhancement in the outer right breast. The biopsy marking
clip is in expected position at the site of biopsy.
IMPRESSION: Appropriate positioning of the cork/cylinder shaped biopsy marking
clip at the site of biopsy in the location.

Final Assessment: Post Procedure Mammograms for Marker Placement

## 2021-10-24 IMAGING — MR MR BREAST BX W/ LOC DEV 1ST LEASION IMAGE BX SPEC MR GUIDE*R*
9 of 12 series · 33 of 48 positions shown · IV contrast (10 ml GADAVIST)
Comparison: Previous exams.
COMPARISON: Previous exams.

Addendum:
CLINICAL DATA: Here for MR guided biopsy non mass enhancement in
the right breast.

EXAM:
MRI GUIDED CORE NEEDLE BIOPSY OF THE RIGHT BREAST
TECHNIQUE: Multiplanar, multisequence MR imaging of the right breast was
performed both before and after administration of intravenous
contrast.
CONTRAST:  10mL GADAVIST GADOBUTROL 1 MMOL/ML IV SOLN

[Series 5: fiducial unilateral · sagittal · 2.0mm · 1.33mm/px · 2 of 50 slices shown]
[im 1/50]
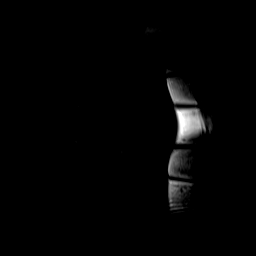
[im 50/50]
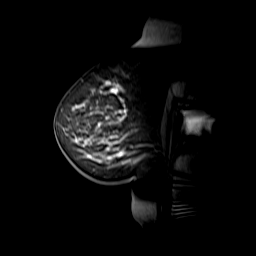

[Series 6: dynamic pre · axial · non-contrast · 1.3mm · 0.73mm/px · z∈[-80,+106]mm · 5 of 144 slices shown]
[im 1/144]
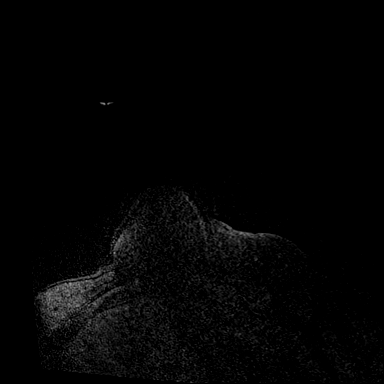
[im 36/144]
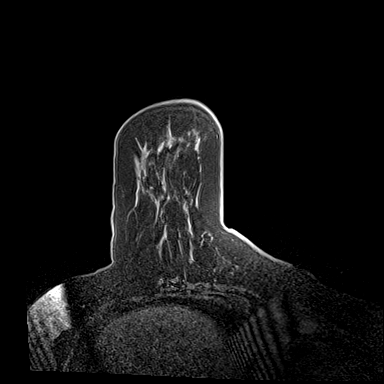
[im 72/144]
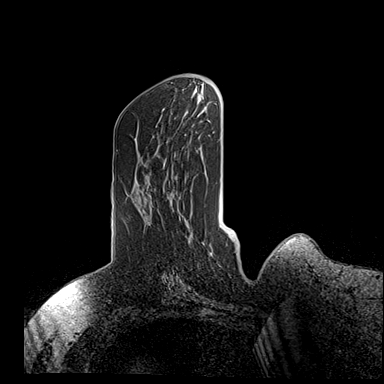
[im 108/144]
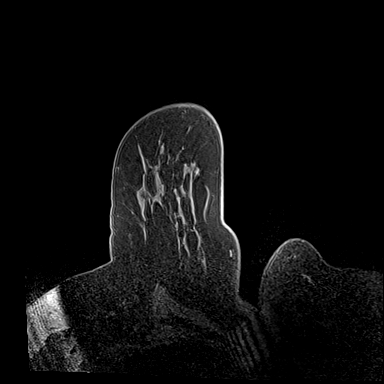
[im 144/144]
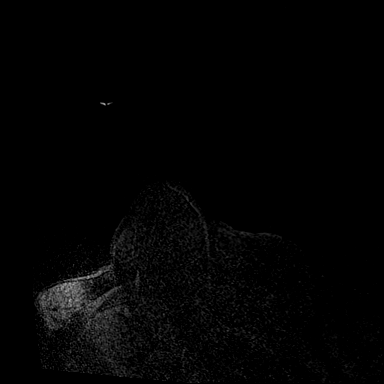

[Series 7: dynamic post 20 · axial · 1.3mm · 0.73mm/px · z∈[-80,+106]mm · 5 of 144 slices shown (1 of 2)]
[im 1/144]
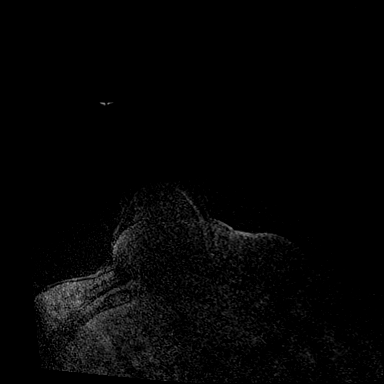
[im 36/144]
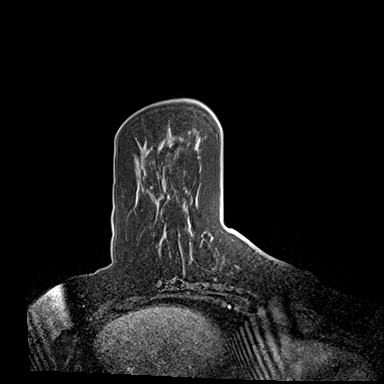
[im 72/144]
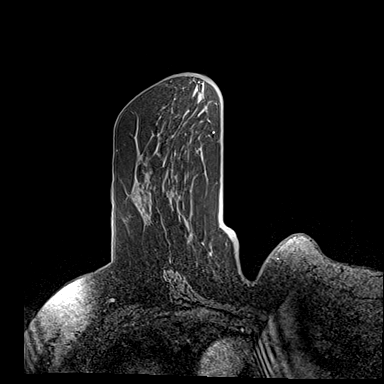
[im 108/144]
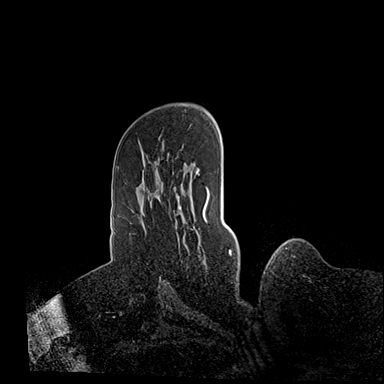
[im 144/144]
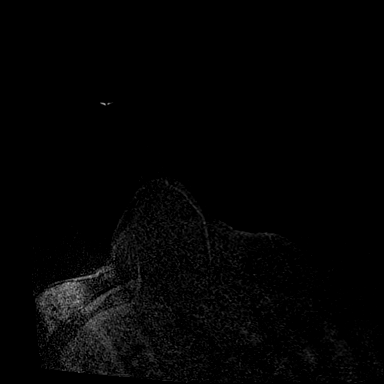

[Series 8: dynamic post 20 · axial · 1.3mm · 0.73mm/px · z∈[-80,+106]mm · 4 of 144 slices shown (2 of 2)]
[im 1/144]
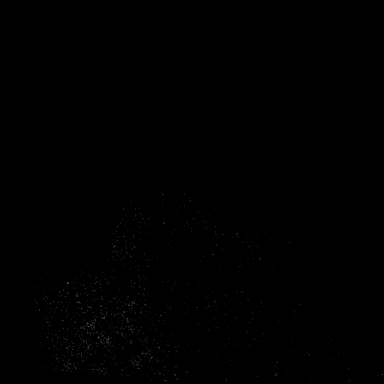
[im 48/144]
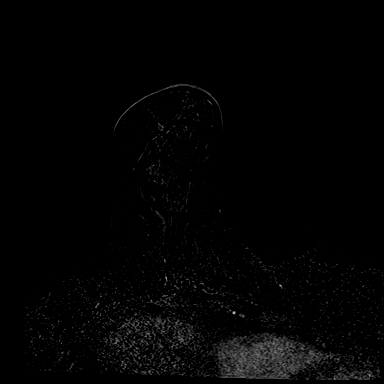
[im 96/144]
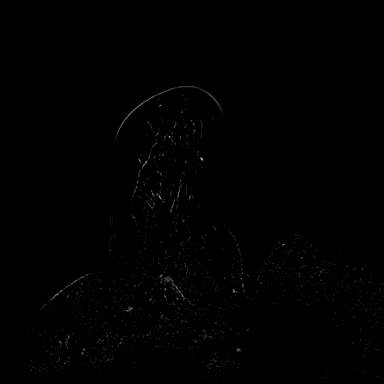
[im 144/144]
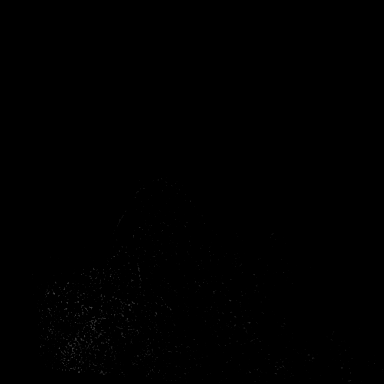

[Series 9: dynamic post 3 · axial · 1.3mm · 0.73mm/px · z∈[-80,+106]mm · 4 of 144 slices shown (1 of 2)]
[im 1/144]
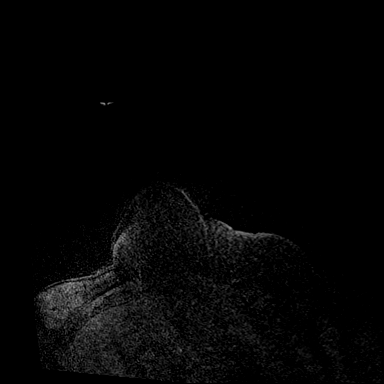
[im 48/144]
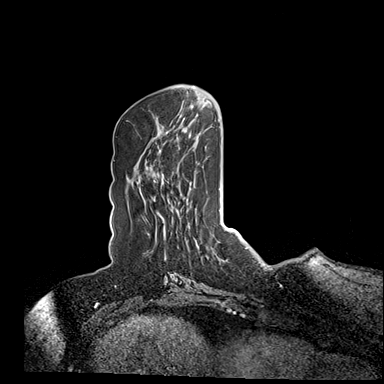
[im 96/144]
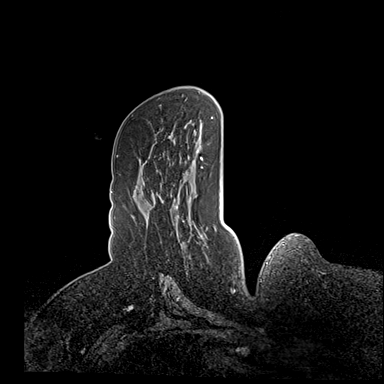
[im 144/144]
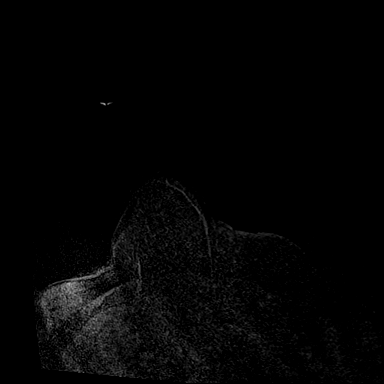

[Series 10: dynamic post 3 · axial · 1.3mm · 0.73mm/px · z∈[-80,+106]mm · 4 of 144 slices shown (2 of 2)]
[im 1/144]
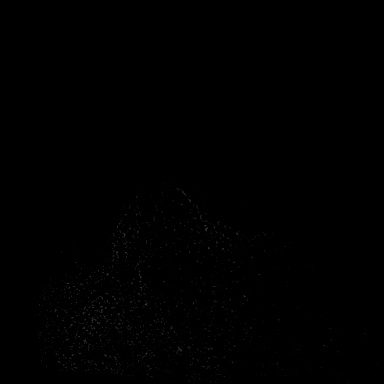
[im 48/144]
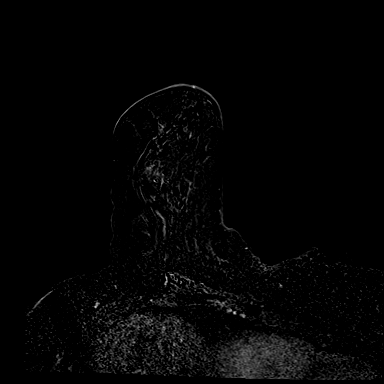
[im 96/144]
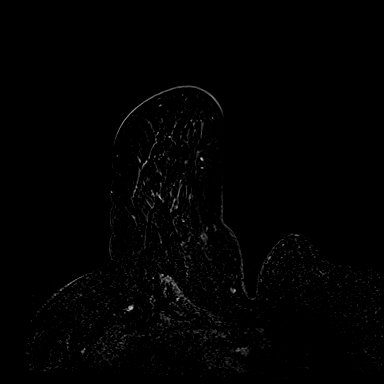
[im 144/144]
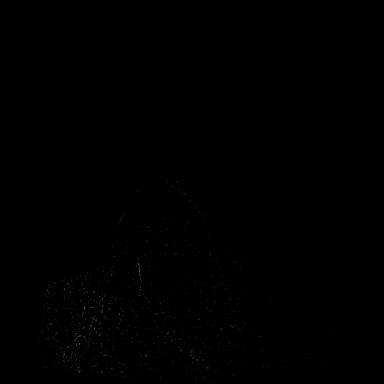

[Series 11: dynamic post 5 · axial · 1.3mm · 0.73mm/px · z∈[-80,+106]mm · 4 of 144 slices shown (1 of 2)]
[im 1/144]
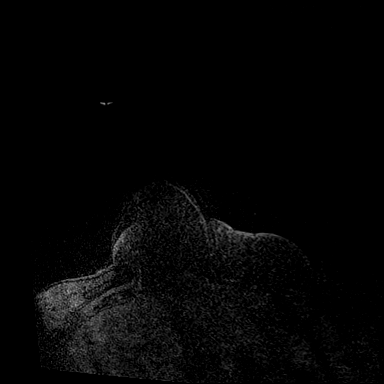
[im 48/144]
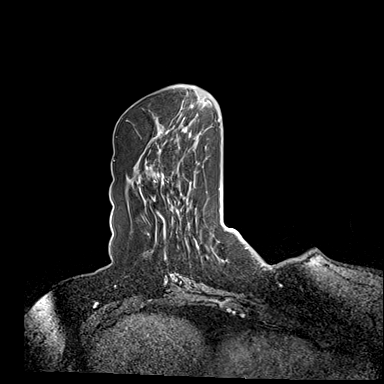
[im 96/144]
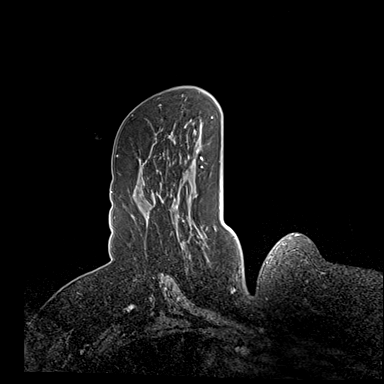
[im 144/144]
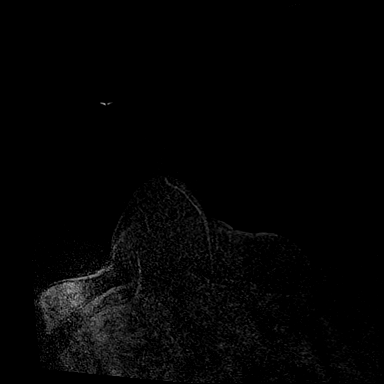

[Series 12: dynamic post 5 · axial · 1.3mm · 0.73mm/px · z∈[-80,+106]mm · 4 of 144 slices shown (2 of 2)]
[im 1/144]
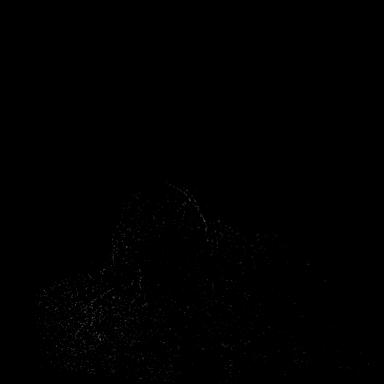
[im 48/144]
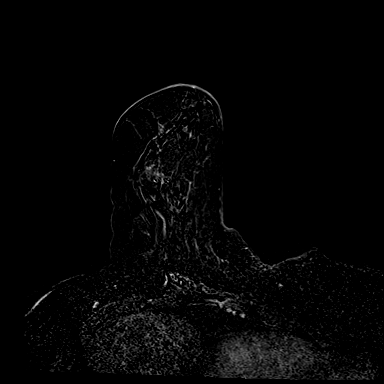
[im 96/144]
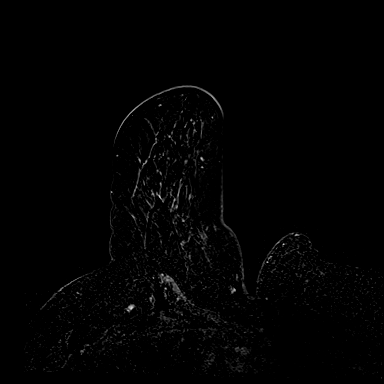
[im 144/144]
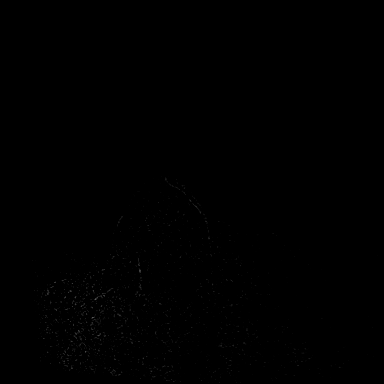

[Series 13: needle confirmation · axial · 1.3mm · 0.73mm/px · 1 of 144 slices shown]
[im 1/144]
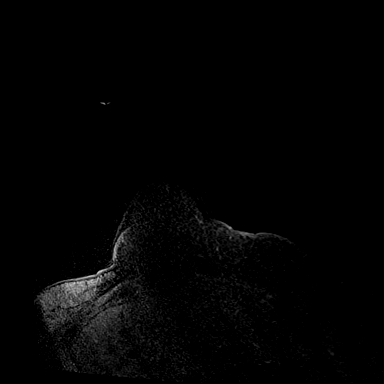

[33 of 48 positions shown; findings below may reference images not displayed]

FINDINGS: I met with the patient, and we discussed the procedure of MRI guided
biopsy, including risks, benefits, and alternatives. Specifically,
we discussed the risks of infection, bleeding, tissue injury, clip
migration, and inadequate sampling. Informed, written consent was
given. The usual time out protocol was performed immediately prior
to the procedure.

Using sterile technique, 1% Lidocaine, MRI guidance, and a 9 gauge
vacuum assisted device, biopsy was performed of non mass enhancement
in the outer right breast at middle depth using a lateral approach.
At the conclusion of the procedure, a cork/cylinder tissue marker
clip was deployed into the biopsy cavity. Follow-up 2-view mammogram
was performed and dictated separately.
IMPRESSION: MRI guided biopsy of non mass enhancement in the right breast. No
apparent complications.

ADDENDUM:
Pathology revealed COLUMNAR CELL AND FIBROCYSTIC CHANGES-
PSEUDOANGIOMATOUS STROMAL HYPERPLASIA- NO MALIGNANCY IDENTIFIED of
the RIGHT breast, cork/cylinder clip. This was found to be
concordant by Dr. ALMEDA.

Pathology results were discussed with the patient by telephone. The
patient reported doing well after the biopsy with tenderness at the
site. Post biopsy instructions and care were reviewed and questions
were answered. The patient was encouraged to call The [REDACTED]

Recommend bilateral breast MRI recommended in 6 months per protocol
to document no suspicious interval change at the site of biopsy. The
patient will be due for annual screening mammogram in [DATE].

Pathology results reported by ALMEDA RN on [DATE].

*** End of Addendum ***
FINDINGS: I met with the patient, and we discussed the procedure of MRI guided
biopsy, including risks, benefits, and alternatives. Specifically,
we discussed the risks of infection, bleeding, tissue injury, clip
migration, and inadequate sampling. Informed, written consent was
given. The usual time out protocol was performed immediately prior
to the procedure.

Using sterile technique, 1% Lidocaine, MRI guidance, and a 9 gauge
vacuum assisted device, biopsy was performed of non mass enhancement
in the outer right breast at middle depth using a lateral approach.
At the conclusion of the procedure, a cork/cylinder tissue marker
clip was deployed into the biopsy cavity. Follow-up 2-view mammogram
was performed and dictated separately.
IMPRESSION: MRI guided biopsy of non mass enhancement in the right breast. No
apparent complications.

## 2021-10-24 MED ORDER — GADOBUTROL 1 MMOL/ML IV SOLN
10.0000 mL | Freq: Once | INTRAVENOUS | Status: AC | PRN
  Administered 2021-10-24: 10 mL via INTRAVENOUS

## 2021-11-10 ENCOUNTER — Telehealth: Payer: Self-pay

## 2021-11-10 NOTE — Telephone Encounter (Signed)
Pt had virtual pv today sched at 11 am, no answer x2, lm at 1115 for pt to call by 1145 and I would do pv otherwise call by 5 to r/s pv and colon.  If no call by 5, let pt know that she will have to call and r/s colon and pv.

## 2021-11-11 ENCOUNTER — Ambulatory Visit (INDEPENDENT_AMBULATORY_CARE_PROVIDER_SITE_OTHER): Payer: BC Managed Care – PPO

## 2021-11-11 ENCOUNTER — Other Ambulatory Visit: Payer: Self-pay

## 2021-11-11 DIAGNOSIS — Z23 Encounter for immunization: Secondary | ICD-10-CM

## 2021-11-11 NOTE — Telephone Encounter (Signed)
Opened in error

## 2021-11-24 ENCOUNTER — Encounter: Payer: BC Managed Care – PPO | Admitting: Gastroenterology

## 2022-04-09 ENCOUNTER — Encounter: Payer: Self-pay | Admitting: Internal Medicine

## 2022-05-20 ENCOUNTER — Other Ambulatory Visit: Payer: Self-pay | Admitting: Obstetrics and Gynecology

## 2022-05-20 DIAGNOSIS — Z1501 Genetic susceptibility to malignant neoplasm of breast: Secondary | ICD-10-CM

## 2022-06-01 ENCOUNTER — Ambulatory Visit (AMBULATORY_SURGERY_CENTER): Payer: Self-pay | Admitting: *Deleted

## 2022-06-01 VITALS — Ht 68.0 in | Wt 242.0 lb

## 2022-06-01 DIAGNOSIS — Z1211 Encounter for screening for malignant neoplasm of colon: Secondary | ICD-10-CM

## 2022-06-01 MED ORDER — NA SULFATE-K SULFATE-MG SULF 17.5-3.13-1.6 GM/177ML PO SOLN: 1.0000 | ORAL | 0 refills | Status: DC

## 2022-06-01 NOTE — Progress Notes (Signed)
Patient is here in-person for PV. Patient denies any allergies to eggs or soy. Patient denies any problems with anesthesia/sedation. Patient is not on any oxygen at home. Patient is not taking any diet/weight loss medications or blood thinners. Patient is aware of our care-partner policy. Patient notified to use Singlecare card given to pt for prescription.   EMMI education assigned to the patient for the procedure, sent to [place].

## 2022-06-04 ENCOUNTER — Ambulatory Visit
Admission: RE | Admit: 2022-06-04 | Discharge: 2022-06-04 | Disposition: A | Discharge status: Home / Self Care | Payer: BC Managed Care – PPO | Source: Ambulatory Visit | Attending: Obstetrics and Gynecology | Admitting: Obstetrics and Gynecology

## 2022-06-04 DIAGNOSIS — Z1501 Genetic susceptibility to malignant neoplasm of breast: Secondary | ICD-10-CM

## 2022-06-04 MED ORDER — GADOBUTROL 1 MMOL/ML IV SOLN
10.0000 mL | Freq: Once | INTRAVENOUS | Status: AC | PRN
  Administered 2022-06-04: 10 mL via INTRAVENOUS

## 2022-06-29 ENCOUNTER — Encounter: Payer: BC Managed Care – PPO | Admitting: Internal Medicine

## 2022-07-22 ENCOUNTER — Encounter: Payer: Self-pay | Admitting: Family Medicine

## 2022-07-22 ENCOUNTER — Ambulatory Visit (INDEPENDENT_AMBULATORY_CARE_PROVIDER_SITE_OTHER): Payer: BC Managed Care – PPO | Admitting: Family Medicine

## 2022-07-22 VITALS — BP 126/82 | HR 78 | Temp 98.7°F | Ht 68.0 in | Wt 246.2 lb

## 2022-07-22 DIAGNOSIS — J301 Allergic rhinitis due to pollen: Secondary | ICD-10-CM | POA: Diagnosis not present

## 2022-07-22 DIAGNOSIS — I493 Ventricular premature depolarization: Secondary | ICD-10-CM | POA: Diagnosis not present

## 2022-07-22 DIAGNOSIS — Z1159 Encounter for screening for other viral diseases: Secondary | ICD-10-CM | POA: Diagnosis not present

## 2022-07-22 DIAGNOSIS — H9192 Unspecified hearing loss, left ear: Secondary | ICD-10-CM | POA: Insufficient documentation

## 2022-07-22 DIAGNOSIS — Z1212 Encounter for screening for malignant neoplasm of rectum: Secondary | ICD-10-CM

## 2022-07-22 DIAGNOSIS — R42 Dizziness and giddiness: Secondary | ICD-10-CM

## 2022-07-22 DIAGNOSIS — Z1509 Genetic susceptibility to other malignant neoplasm: Secondary | ICD-10-CM

## 2022-07-22 DIAGNOSIS — Z1501 Genetic susceptibility to malignant neoplasm of breast: Secondary | ICD-10-CM | POA: Diagnosis not present

## 2022-07-22 DIAGNOSIS — Z1211 Encounter for screening for malignant neoplasm of colon: Secondary | ICD-10-CM | POA: Diagnosis not present

## 2022-07-22 DIAGNOSIS — Z0000 Encounter for general adult medical examination without abnormal findings: Secondary | ICD-10-CM

## 2022-07-22 LAB — COMPREHENSIVE METABOLIC PANEL
ALT: 12 U/L (ref 0–35)
AST: 14 U/L (ref 0–37)
Albumin: 4.2 g/dL (ref 3.5–5.2)
Alkaline Phosphatase: 48 U/L (ref 39–117)
BUN: 13 mg/dL (ref 6–23)
CO2: 26 mEq/L (ref 19–32)
Calcium: 9 mg/dL (ref 8.4–10.5)
Chloride: 102 mEq/L (ref 96–112)
Creatinine, Ser: 0.73 mg/dL (ref 0.40–1.20)
GFR: 95.2 mL/min (ref >60.00)
Glucose, Bld: 86 mg/dL (ref 70–99)
Potassium: 4 mEq/L (ref 3.5–5.1)
Sodium: 136 mEq/L (ref 135–145)
Total Bilirubin: 0.7 mg/dL (ref 0.2–1.2)
Total Protein: 7.4 g/dL (ref 6.0–8.3)

## 2022-07-22 LAB — LIPID PANEL
Cholesterol: 200 mg/dL (ref 0–200)
HDL: 50.9 mg/dL (ref >39.00)
LDL Cholesterol: 128 mg/dL — ABNORMAL HIGH (ref 0–99)
NonHDL: 149.31
Total CHOL/HDL Ratio: 4
Triglycerides: 107 mg/dL (ref 0.0–149.0)
VLDL: 21.4 mg/dL (ref 0.0–40.0)

## 2022-07-22 LAB — CBC WITH DIFFERENTIAL/PLATELET
Basophils Absolute: 0.1 10*3/uL (ref 0.0–0.1)
Basophils Relative: 1.1 % (ref 0.0–3.0)
Eosinophils Absolute: 0.2 10*3/uL (ref 0.0–0.7)
Eosinophils Relative: 3.8 % (ref 0.0–5.0)
HCT: 42.5 % (ref 36.0–46.0)
Hemoglobin: 14.3 g/dL (ref 12.0–15.0)
Lymphocytes Relative: 23 % (ref 12.0–46.0)
Lymphs Abs: 1.3 10*3/uL (ref 0.7–4.0)
MCHC: 33.6 g/dL (ref 30.0–36.0)
MCV: 88 fl (ref 78.0–100.0)
Monocytes Absolute: 0.3 10*3/uL (ref 0.1–1.0)
Monocytes Relative: 6 % (ref 3.0–12.0)
Neutro Abs: 3.9 10*3/uL (ref 1.4–7.7)
Neutrophils Relative %: 66.1 % (ref 43.0–77.0)
Platelets: 268 10*3/uL (ref 150.0–400.0)
RBC: 4.83 Mil/uL (ref 3.87–5.11)
RDW: 13.3 % (ref 11.5–15.5)
WBC: 5.9 10*3/uL (ref 4.0–10.5)

## 2022-07-22 LAB — TSH: TSH: 2.37 u[IU]/mL (ref 0.35–5.50)

## 2022-07-22 MED ORDER — FLUTICASONE PROPIONATE 50 MCG/ACT NA SUSP: 1.0000 | Freq: Every day | NASAL | 6 refills | Status: AC

## 2022-07-22 NOTE — Patient Instructions (Signed)

## 2022-07-22 NOTE — Progress Notes (Signed)
Subjective  Chief Complaint  Patient presents with   Annual Exam    Pt here for Annual exam and is  currently fasting. Pt has an appt for colonoscopy next week but she will have to reschedule     HPI: Becky Taylor is a 51 y.o. female who presents to [facility] [place] today for a Female Wellness Visit. She also has the concerns and/or needs as listed above in the chief complaint. These will be addressed in addition to the Health Maintenance Visit.   Wellness Visit: annual visit with health maintenance review and exam without Pap  HM: saw Dr. Terri Piedra in June: I revewed notes. High risk for breast cancer: for breast MRI alternating with mammo q 6. For prophylactic BSO in the fall. Colonoscopy scheduled in July but due to conflict she will need to reschedule.. Imms current. Feeling well Chronic disease f/u and/or acute problem visit: (deemed necessary to be done in addition to the wellness visit): BRCA: Breast cancer screening program.  Had recent normal breast MRI in June.  Will have follow-up mammogram in November.  Prophylactic BSO this fall.  Feels good about things overall. Frequent symptomatic PVCs: Reviewed cardiology records from 2012.  He had prior normal stress echocardiogram.  Heart monitor revealed frequent PVCs.  Has not been treated.  She reports she gets intermittent mild PVCs every 2 to 3 days.  Worse if she drinks more caffeine.  She never has associated chest pain, shortness of breath or lightheadedness.  Has not been back to cardiology since. Complains of hearing loss on the left.  Noticed by most coworkers and family.  Listens to TV loudly.  Has never had hearing screen.  Admits to allergies, on oral antihistamine.  Also with chronic vertigo.  She reports it is intermittent and has been present for decades.  Not worsening.  Rare tinnitus.  No family history of Mnire's.  Assessment  1. Annual physical exam   2. BRCA2 gene mutation positive   3.  Frequent PVCs   4. Screening for colorectal cancer   5. Vertigo   6. Seasonal allergic rhinitis due to pollen   7. Need for hepatitis C screening test      Plan  Female Wellness Visit: Age appropriate Health Maintenance and Prevention measures were discussed with patient. Included topics are cancer screening recommendations, ways to keep healthy (see AVS) including dietary and exercise recommendations, regular eye and dental care, use of seat belts, and avoidance of moderate alcohol use and tobacco use.  Patient will reschedule colonoscopy.  Other screens are current. BMI: discussed patient's BMI and encouraged positive lifestyle modifications to help get to or maintain a target BMI. HM needs and immunizations were addressed and ordered. See below for orders. See HM and immunization section for updates. Routine labs and screening tests ordered including cmp, cbc and lipids where appropriate. Discussed recommendations regarding Vit D and calcium supplementation (see AVS)  Chronic disease management visit and/or acute problem visit: BRCA2 positive: For BSO, high risk breast cancer screening. Vertigo and hearing loss: Subjective.  Passed hearing screen bilaterally today in office.  Will treat allergies with Flonase and oral antihistamine.  She will see if this helps her hearing.  If not recommend formal audiometry.  Consider Mnire's given associated vertigo.  Education counseling given. PVCs: Symptomatic.  They are not bothersome.  Reassured.  Monitor.  No need for medications or further workup at this time.  Avoid caffeine  Follow up: 12 months for  your complete annual physical exam with blood work. Please come fasting. Orders Placed This Encounter  Procedures   Hepatitis C Antibody   CBC with Differential/Platelet   Comprehensive metabolic panel   Lipid panel   TSH   Meds ordered this encounter  Medications   fluticasone (FLONASE) 50 MCG/ACT nasal spray    Sig: Place 1 spray into  both nostrils daily.    Dispense:  16 g    Refill:  6      Body mass index is 37.43 kg/m. Wt Readings from Last 3 Encounters:  07/22/22 246 lb 3.2 oz (111.7 kg)  06/01/22 242 lb (109.8 kg)  03/10/21 221 lb 6.4 oz (100.4 kg)     Patient Active Problem List   Diagnosis Date Noted   Seasonal allergic rhinitis due to pollen 07/22/2022   Hearing loss of left ear 07/22/2022   Essential tremor 10/24/2019   Obesity (BMI 30-39.9) 09/20/2019   BRCA gene mutation positive 09/20/2019   Family history of breast cancer in mother 09/20/2019   Menorrhagia 11/09/2017   Frequent PVCs 10/15/2011    eval 2014 Dr. Percival Spanish: nl stress echo (previously) and heart monitor with PVCs. asymptomatic    Health Maintenance  Topic Date Due   Hepatitis C Screening  Never done   COLONOSCOPY (Pts 45-50yr Insurance coverage will need to be confirmed)  Never done   INFLUENZA VACCINE  07/14/2022   COVID-19 Vaccine (4 - Pfizer series) 08/07/2022 (Originally 01/29/2021)   MAMMOGRAM  06/11/2023   PAP SMEAR-Modifier  07/16/2024   TETANUS/TDAP  10/23/2029   HIV Screening  Completed   Zoster Vaccines- Shingrix  Completed   HPV VACCINES  Aged OUniversal HealthHistory  Administered Date(s) Administered   Influenza Inj Mdck Quad Pf 11/07/2019   Influenza,inj,Quad PF,6-35 Mos 09/13/2020   PFIZER(Purple Top)SARS-COV-2 Vaccination 02/16/2020, 03/20/2020, 12/04/2020   Tdap 10/24/2019   Zoster Recombinat (Shingrix) 03/10/2021, 11/11/2021   We updated and reviewed the patient's past history in detail and it is documented below. Allergies: Patient is allergic to other. Past Medical History Patient  has a past medical history of BRCA2 gene mutation positive, Eosinophilic esophagitis, Family history of breast cancer, Obesity, and Palpitations. Past Surgical History Patient  has a past surgical history that includes Adenoidectomy; Breast biopsy (Right, 10/24/2021); and Upper gastrointestinal endoscopy. Family  History: Patient family history includes Arthritis in her paternal grandmother; Asthma in her paternal grandmother; BRCA 1/2 in her mother and sister; Breast cancer (age of onset: 621 in her mother; Cancer in her maternal grandfather and paternal grandfather; Healthy in her sister; Hearing loss in her paternal grandfather and paternal grandmother; Heart attack in her maternal grandfather; Heart disease in her maternal grandfather; High Cholesterol in her maternal grandmother; Hyperlipidemia in her mother; Hypertension in her mother; Neurologic Disorder in her father; Stroke in her maternal grandmother. Social History:  Patient  reports that she has never smoked. She has never used smokeless tobacco. She reports current alcohol use. She reports that she does not use drugs.  Review of Systems: Constitutional: negative for fever or malaise Ophthalmic: negative for photophobia, double vision or loss of vision Cardiovascular: negative for chest pain, dyspnea on exertion, or new LE swelling Respiratory: negative for SOB or persistent cough Gastrointestinal: negative for abdominal pain, change in bowel habits or melena Genitourinary: negative for dysuria or gross hematuria, no abnormal uterine bleeding or disharge Musculoskeletal: negative for new gait disturbance or muscular weakness Integumentary: negative for new or persistent rashes, no breast lumps  Neurological: negative for TIA or stroke symptoms Psychiatric: negative for SI or delusions Allergic/Immunologic: negative for hives  Patient Care Team    Relationship Specialty Notifications Start End  Leamon Arnt, MD PCP - General Family Medicine  09/20/19   Sherlyn Hay, DO Consulting Physician Obstetrics and Gynecology  09/20/19   Minus Breeding, MD Consulting Physician Cardiology  07/22/22     Objective  Vitals: BP 126/82   Pulse 78   Temp 98.7 F (37.1 C)   Ht _0  (1.727 m)   Wt 246 lb 3.2 oz (111.7 kg)   SpO2 95%   BMI  37.43 kg/m  General:  Well developed, well nourished, no acute distress  Psych:  Alert and orientedx3,normal mood and affect HEENT:  Normocephalic, atraumatic, non-icteric sclera,  supple neck without adenopathy, mass or thyromegaly, TMs clear bilaterally, nasal mucosa is inflamed with clear rhinorrhea Cardiovascular:  Normal S1, S2, RRR without gallop, rub or murmur Respiratory:  Good breath sounds bilaterally, CTAB with normal respiratory effort Gastrointestinal: normal bowel sounds, soft, non-tender, no noted masses. No HSM MSK: no deformities, contusions. Joints are without erythema or swelling.  Skin:  Warm, no rashes or suspicious lesions noted Neurologic:    Mental status is normal. CN 2-11 are normal. Gross motor and sensory exams are normal. Normal gait. No tremor   Commons side effects, risks, benefits, and alternatives for medications and treatment plan prescribed today were discussed, and the patient expressed understanding of the given instructions. Patient is instructed to call or message via MyChart if he/she has any questions or concerns regarding our treatment plan. No barriers to understanding were identified. We discussed Red Flag symptoms and signs in detail. Patient expressed understanding regarding what to do in case of urgent or emergency type symptoms.  Medication list was reconciled, printed and provided to the patient in AVS. Patient instructions and summary information was reviewed with the patient as documented in the AVS. This note was prepared with assistance of Dragon voice recognition software. Occasional wrong-word or sound-a-like substitutions may have occurred due to the inherent limitations of voice recognition software  This visit occurred during the SARS-CoV-2 public health emergency.  Safety protocols were in place, including screening questions prior to the visit, additional usage of staff PPE, and extensive cleaning of exam room while observing appropriate  contact time as indicated for disinfecting solutions.

## 2022-07-23 ENCOUNTER — Encounter: Payer: Self-pay | Admitting: Internal Medicine

## 2022-07-23 LAB — HEPATITIS C ANTIBODY: Hepatitis C Ab: NONREACTIVE

## 2022-07-30 ENCOUNTER — Encounter: Payer: BC Managed Care – PPO | Admitting: Internal Medicine

## 2022-09-07 ENCOUNTER — Encounter: Payer: Self-pay | Admitting: *Deleted

## 2022-09-17 ENCOUNTER — Encounter: Payer: Self-pay | Admitting: Internal Medicine

## 2022-09-19 ENCOUNTER — Encounter: Payer: Self-pay | Admitting: Certified Registered Nurse Anesthetist

## 2022-09-23 ENCOUNTER — Ambulatory Visit (AMBULATORY_SURGERY_CENTER): Payer: BC Managed Care – PPO | Admitting: Internal Medicine

## 2022-09-23 ENCOUNTER — Encounter: Payer: Self-pay | Admitting: Internal Medicine

## 2022-09-23 VITALS — BP 110/84 | HR 65 | Temp 98.2°F | Resp 14 | Ht 68.0 in | Wt 248.0 lb

## 2022-09-23 DIAGNOSIS — D122 Benign neoplasm of ascending colon: Secondary | ICD-10-CM | POA: Diagnosis not present

## 2022-09-23 DIAGNOSIS — Z1211 Encounter for screening for malignant neoplasm of colon: Secondary | ICD-10-CM | POA: Diagnosis not present

## 2022-09-23 MED ORDER — SODIUM CHLORIDE 0.9 % IV SOLN: 500.0000 mL | Freq: Once | INTRAVENOUS | Status: DC

## 2022-09-23 NOTE — Progress Notes (Signed)
GASTROENTEROLOGY PROCEDURE H&P NOTE   Primary Care Physician: Leamon Arnt, MD    Reason for Procedure:  Colon cancer screening  Plan:    Colonoscopy  Patient is appropriate for endoscopic procedure(s) in the ambulatory ([place]) setting.  The nature of the procedure, as well as the risks, benefits, and alternatives were carefully and thoroughly reviewed with the patient. Ample time for discussion and questions allowed. The patient understood, was satisfied, and agreed to proceed.     HPI: Becky Taylor is a 51 y.o. female who presents for screening colonoscopy.  Medical history as below.  Tolerated the prep.  No recent chest pain or shortness of breath.  No abdominal pain today.  Past Medical History:  Diagnosis Date   BRCA2 gene mutation positive    Eosinophilic esophagitis    Hx of   Family history of breast cancer    Obesity    Palpitations     Past Surgical History:  Procedure Laterality Date   ADENOIDECTOMY     BREAST BIOPSY Right 10/24/2021   UPPER GASTROINTESTINAL ENDOSCOPY     12 years ago -[place]    Prior to Admission medications   Medication Sig Start Date End Date Taking? Authorizing Provider  fluticasone (FLONASE) 50 MCG/ACT nasal spray Place 1 spray into both nostrils daily. 07/22/22   Leamon Arnt, MD    Current Outpatient Medications  Medication Sig Dispense Refill   fluticasone (FLONASE) 50 MCG/ACT nasal spray Place 1 spray into both nostrils daily. 16 g 6   Current Facility-Administered Medications  Medication Dose Route Frequency Provider Last Rate Last Admin   0.9 %  sodium chloride infusion  500 mL Intravenous Once Keltin Baird, Lajuan Lines, MD        Allergies as of 09/23/2022 - Review Complete 09/23/2022  Allergen Reaction Noted   Other Anaphylaxis 10/15/2011    Family History  Problem Relation Age of Onset   Hyperlipidemia Mother    Hypertension Mother    BRCA 1/2 Mother    Breast cancer Mother 39   Neurologic Disorder  Father        progressive supranuclear palsy   Healthy Sister        BRCA negative   BRCA 1/2 Sister        GT neg   High Cholesterol Maternal Grandmother    Stroke Maternal Grandmother    Cancer Maternal Grandfather    Heart attack Maternal Grandfather    Heart disease Maternal Grandfather    Arthritis Paternal Grandmother    Asthma Paternal Grandmother    Hearing loss Paternal Grandmother    Cancer Paternal Grandfather    Hearing loss Paternal Grandfather    Colon cancer Neg Hx    Esophageal cancer Neg Hx    Stomach cancer Neg Hx     Social History   Socioeconomic History   Marital status: Married    Spouse name: Not on file   Number of children: 2   Years of education: Not on file   Highest education level: Not on file  Occupational History   Occupation: Psychologist, counselling: [employer]  Tobacco Use   Smoking status: Never   Smokeless tobacco: Never  Vaping Use   Vaping Use: Never used  Substance and Sexual Activity   Alcohol use: Yes    Comment: occasional   Drug use: Never   Sexual activity: Yes    Birth control/protection: None  Other Topics Concern   Not on file  Social History Narrative   Not on file   Social Determinants of Health   Financial Resource Strain: Not on file  Food Insecurity: Not on file  Transportation Needs: Not on file  Physical Activity: Not on file  Stress: Not on file  Social Connections: Not on file  Intimate Partner Violence: Not on file    Physical Exam: Vital signs in last 24 hours: _0  (!) 144/83   Pulse 82   Temp 98.2 F (36.8 C) (Temporal)   Ht _1  (1.727 m)   Wt 248 lb (112.5 kg)   SpO2 99%   BMI 37.71 kg/m  GEN: NAD EYE: Sclerae anicteric ENT: MMM CV: Non-tachycardic Pulm: CTA b/l GI: Soft, NT/ND NEURO:  Alert & Oriented x 3   Zenovia Jarred, MD [facility]  09/23/2022 9:32 AM

## 2022-09-23 NOTE — Progress Notes (Signed)
Called to room to assist during endoscopic procedure.  Patient ID and intended procedure confirmed with present staff. Received instructions for my participation in the procedure from the performing physician.  

## 2022-09-23 NOTE — Patient Instructions (Signed)
Handouts provided on polyps and diverticulosis.   Continue present medications. Await pathology results.   YOU HAD AN ENDOSCOPIC PROCEDURE TODAY AT [facility]:   Refer to the procedure report that was given to you for any specific questions about what was found during the examination.  If the procedure report does not answer your questions, please call your gastroenterologist to clarify.  If you requested that your care partner not be given the details of your procedure findings, then the procedure report has been included in a sealed envelope for you to review at your convenience later.  YOU SHOULD EXPECT: Some feelings of bloating in the abdomen. Passage of more gas than usual.  Walking can help get rid of the air that was put into your GI tract during the procedure and reduce the bloating. If you had a lower endoscopy (such as a colonoscopy or flexible sigmoidoscopy) you may notice spotting of blood in your stool or on the toilet paper. If you underwent a bowel prep for your procedure, you may not have a normal bowel movement for a few days.  Please Note:  You might notice some irritation and congestion in your nose or some drainage.  This is from the oxygen used during your procedure.  There is no need for concern and it should clear up in a day or so.  SYMPTOMS TO REPORT IMMEDIATELY:  Following lower endoscopy (colonoscopy or flexible sigmoidoscopy):  Excessive amounts of blood in the stool  Significant tenderness or worsening of abdominal pains  Swelling of the abdomen that is new, acute  Fever of 100F or higher  For urgent or emergent issues, a gastroenterologist can be reached at any hour by calling [phone]. Do not use MyChart messaging for urgent concerns.    DIET:  We do recommend a small meal at first, but then you may proceed to your regular diet.  Drink plenty of fluids but you should avoid alcoholic beverages for 24 hours.  ACTIVITY:  You should plan to  take it easy for the rest of today and you should NOT DRIVE or use heavy machinery until tomorrow (because of the sedation medicines used during the test).    FOLLOW UP: Our staff will call the number listed on your records the next business day following your procedure.  We will call around 7:15- 8:00 am to check on you and address any questions or concerns that you may have regarding the information given to you following your procedure. If we do not reach you, we will leave a message.     If any biopsies were taken you will be contacted by phone or by letter within the next 1-3 weeks.  Please call us at [phone] if you have not heard about the biopsies in 3 weeks.    SIGNATURES/CONFIDENTIALITY: You and/or your care partner have signed paperwork which will be entered into your electronic medical record.  These signatures attest to the fact that that the information above on your After Visit Summary has been reviewed and is understood.  Full responsibility of the confidentiality of this discharge information lies with you and/or your care-partner.

## 2022-09-23 NOTE — Progress Notes (Signed)
VSS, transported to PACU °

## 2022-09-23 NOTE — Op Note (Signed)
[facility] Patient Name: Becky Taylor Procedure Date: 09/23/2022 9:27 AM MRN: [id number] Endoscopist: Jerene Bears , MD Age: 50 Referring MD:  Date of Birth: 12-Feb-1971 Gender: Female Account #: [id number] Procedure:                Colonoscopy Indications:              Screening for colorectal malignant neoplasm, This                            is the patient's first colonoscopy Medicines:                Monitored Anesthesia Care Procedure:                Pre-Anesthesia Assessment:                           - Prior to the procedure, a History and Physical                            was performed, and patient medications and                            allergies were reviewed. The patient's tolerance of                            previous anesthesia was also reviewed. The risks                            and benefits of the procedure and the sedation                            options and risks were discussed with the patient.                            All questions were answered, and informed consent                            was obtained. Prior Anticoagulants: The patient has                            taken no previous anticoagulant or antiplatelet                            agents. ASA Grade Assessment: II - A patient with                            mild systemic disease. After reviewing the risks                            and benefits, the patient was deemed in                            satisfactory condition to undergo the procedure.  After obtaining informed consent, the colonoscope                            was passed under direct vision. Throughout the                            procedure, the patient's blood pressure, pulse, and                            oxygen saturations were monitored continuously. The                            CF [id number] #[id number] was introduced through the anus                            and advanced to  the cecum, identified by                            appendiceal orifice and ileocecal valve. The                            colonoscopy was performed without difficulty. The                            patient tolerated the procedure well. The quality                            of the bowel preparation was good. The ileocecal                            valve, appendiceal orifice, and rectum were                            photographed. Scope In: 9:44:37 AM Scope Out: 9:59:33 AM Scope Withdrawal Time: 0 hours 12 minutes 16 seconds  Total Procedure Duration: 0 hours 14 minutes 56 seconds  Findings:                 The digital rectal exam was normal.                           Two sessile polyps were found in the ascending                            colon. The polyps were 3 to 4 mm in size. These                            polyps were removed with a cold snare. Resection                            and retrieval were complete.                           Multiple small and large-mouthed diverticula were  found in the sigmoid colon, descending colon,                            hepatic flexure and ascending colon.                           The exam was otherwise without abnormality. Complications:            No immediate complications. Estimated Blood Loss:     Estimated blood loss was minimal. Impression:               - Two 3 to 4 mm polyps in the ascending colon,                            removed with a cold snare. Resected and retrieved.                           - Mild diverticulosis in the sigmoid colon, in the                            descending colon, at the hepatic flexure and in the                            ascending colon.                           - The examination was otherwise normal. Recommendation:           - Patient has a contact number available for                            emergencies. The signs and symptoms of potential                             delayed complications were discussed with the                            patient. Return to normal activities tomorrow.                            Written discharge instructions were provided to the                            patient.                           - Resume previous diet.                           - Continue present medications.                           - Await pathology results.                           - Repeat colonoscopy is recommended. The  colonoscopy date will be determined after pathology                            results from today's exam become available for                            review. Jerene Bears, MD 09/23/2022 10:01:54 AM This report has been signed electronically.

## 2022-09-23 NOTE — Progress Notes (Signed)
Pt's states no medical or surgical changes since previsit or office visit. 

## 2022-09-24 ENCOUNTER — Telehealth: Payer: Self-pay | Admitting: *Deleted

## 2022-09-24 NOTE — Telephone Encounter (Signed)
  Follow up Call-     09/23/2022    9:07 AM  Call back number  Post procedure Call Back phone  # [phone]  Permission to leave phone message Yes     Patient questions:  Message left to call us if necessary.

## 2022-09-28 ENCOUNTER — Encounter: Payer: Self-pay | Admitting: Internal Medicine

## 2022-11-26 ENCOUNTER — Encounter: Payer: Self-pay | Admitting: *Deleted

## 2023-02-15 LAB — HM MAMMOGRAPHY

## 2023-05-05 LAB — HM PAP SMEAR

## 2023-05-05 LAB — RESULTS CONSOLE HPV: CHL HPV: NEGATIVE

## 2023-06-15 ENCOUNTER — Encounter (HOSPITAL_BASED_OUTPATIENT_CLINIC_OR_DEPARTMENT_OTHER): Payer: Self-pay | Admitting: Obstetrics and Gynecology

## 2023-06-24 ENCOUNTER — Other Ambulatory Visit: Payer: Self-pay

## 2023-06-24 ENCOUNTER — Encounter (HOSPITAL_BASED_OUTPATIENT_CLINIC_OR_DEPARTMENT_OTHER): Payer: Self-pay | Admitting: Obstetrics and Gynecology

## 2023-06-24 NOTE — Progress Notes (Signed)
Spoke with Dahlia Client at dr Mindi Slicker office and made aware unable to contact pt for 07-09-2023 surgery

## 2023-06-24 NOTE — Progress Notes (Addendum)
Spoke w/ via phone for pre-op interview---pt Lab needs dos----  urine preg, surgery orders req dr Mindi Slicker epic ib             Lab results------none COVID test -----patient states asymptomatic no test needed Arrive at -------530 am 07-09-2023 NPO after MN NO Solid Food.  Clear liquids from MN until---430 am Med rec completed Medications to take morning of surgery -----alavert Diabetic medication -----n/a Patient instructed no nail polish to be worn day of surgery Patient instructed to bring photo id and insurance card day of surgery Patient aware to have Driver (ride ) / caregiver  husband kipp  for 24 hours after surgery  Patient Special Instructions -----none Pre-Op special Instructions -----none Patient verbalized understanding of instructions that were given at this phone interview. Patient denies shortness of breath, chest pain, fever, cough at this phone interview.

## 2023-06-28 NOTE — H&P (Signed)
Becky Taylor is an 52 y.o.G53P2002 female here for scheduled laparoscopic bilateral salpingo-oopherectomy. Her mother had breast cancer at age 52 ; her maternal grandmother was diagnosed in her 30s. Many of her family members have had brca testing as well - her maternal aunt and uncle tested positive. Her daughters have not been tested yet - in their 62s.  After meeting with genetic counselor patient is interested in prophylactic BSO  Pertinent Gynecological History: Menses: post-menopausal Bleeding: none Contraception: none DES exposure: denies Blood transfusions: none Sexually transmitted diseases: no past history Previous GYN Procedures:  n/a   Last mammogram: normal Date: 2022 Last pap: normal Date: 2022 OB History: G2, P2002   Menstrual History: Menarche age:  60 Patient's last menstrual period was 06/03/2023.    Past Medical History:  Diagnosis Date   Allergic rhinitis    Anxiety    basal cell carcinoma    BRCA2 gene mutation positive    Eosinophilic esophagitis    Hx of   Family history of breast cancer    history of Frequent PVCs 2013   Obesity    Palpitations    Wears contact lenses     Past Surgical History:  Procedure Laterality Date   ADENOIDECTOMY     basal cell carcimona removed  2018   BREAST BIOPSY Right 10/24/2021   UPPER GASTROINTESTINAL ENDOSCOPY     12 years ago -[place]    Family History  Problem Relation Age of Onset   Hyperlipidemia Mother    Hypertension Mother    BRCA 1/2 Mother    Breast cancer Mother 13   Neurologic Disorder Father        progressive supranuclear palsy   Healthy Sister        BRCA negative   BRCA 1/2 Sister        GT neg   High Cholesterol Maternal Grandmother    Stroke Maternal Grandmother    Cancer Maternal Grandfather    Heart attack Maternal Grandfather    Heart disease Maternal Grandfather    Arthritis Paternal Grandmother    Asthma Paternal Grandmother    Hearing loss Paternal Grandmother     Cancer Paternal Grandfather    Hearing loss Paternal Grandfather    Colon cancer Neg Hx    Esophageal cancer Neg Hx    Stomach cancer Neg Hx     Social History:  reports that she has never smoked. She has never used smokeless tobacco. She reports current alcohol use. She reports that she does not use drugs.  Allergies:  Allergies  Allergen Reactions   Other Anaphylaxis    Watermelon, sweet potatoes, cucumbers,and oranges.    No medications prior to admission.    Review of Systems  Constitutional:  Negative for activity change and fatigue.  Eyes:  Negative for visual disturbance.  Respiratory:  Negative for chest tightness and shortness of breath.   Cardiovascular:  Negative for chest pain, palpitations and leg swelling.  Gastrointestinal:  Negative for abdominal pain.  Genitourinary:  Negative for menstrual problem, pelvic pain and vaginal bleeding.  Musculoskeletal:  Negative for myalgias.  Neurological:  Negative for light-headedness and headaches.  Psychiatric/Behavioral:  The patient is not nervous/anxious.     Height 5\' 8"  (1.727 m), weight 108.9 kg, last menstrual period 06/03/2023. Physical Exam Vitals and nursing note reviewed. Exam conducted with a chaperone present.  Constitutional:      Appearance: Normal appearance. She is normal weight.  Cardiovascular:     Rate and Rhythm: Normal  rate.     Pulses: Normal pulses.  Pulmonary:     Effort: Pulmonary effort is normal.  Abdominal:     General: Abdomen is flat. Bowel sounds are normal.     Palpations: Abdomen is soft.  Genitourinary:    General: Normal vulva.  Musculoskeletal:        General: Normal range of motion.     Cervical back: Normal range of motion.  Skin:    General: Skin is warm and dry.     Capillary Refill: Capillary refill takes 2 to 3 seconds.  Neurological:     General: No focal deficit present.     Mental Status: She is alert and oriented to person, place, and time. Mental status is at  baseline.  Psychiatric:        Mood and Affect: Mood normal.        Behavior: Behavior normal.        Thought Content: Thought content normal.        Judgment: Judgment normal.     No results found for this or any previous visit (from the past 24 hour(s)).  No results found.  Assessment/Plan: 52yo G2P2002 female here for scheduled prophylactic bilateral salpingo-oopherectomy - Admit - Verify consent  - ERAS protocol - To OR when ready   Janean Sark Abelino Tippin 06/28/2023, 12:18 PM

## 2023-07-09 ENCOUNTER — Ambulatory Visit (HOSPITAL_BASED_OUTPATIENT_CLINIC_OR_DEPARTMENT_OTHER)
Admission: RE | Admit: 2023-07-09 | Discharge: 2023-07-09 | Disposition: A | Discharge status: Home / Self Care | Payer: BC Managed Care – PPO | Attending: Obstetrics and Gynecology | Admitting: Obstetrics and Gynecology

## 2023-07-09 ENCOUNTER — Other Ambulatory Visit: Payer: Self-pay

## 2023-07-09 ENCOUNTER — Ambulatory Visit (HOSPITAL_BASED_OUTPATIENT_CLINIC_OR_DEPARTMENT_OTHER): Payer: BC Managed Care – PPO | Admitting: Anesthesiology

## 2023-07-09 ENCOUNTER — Encounter (HOSPITAL_BASED_OUTPATIENT_CLINIC_OR_DEPARTMENT_OTHER): Payer: Self-pay | Admitting: Obstetrics and Gynecology

## 2023-07-09 ENCOUNTER — Encounter (HOSPITAL_BASED_OUTPATIENT_CLINIC_OR_DEPARTMENT_OTHER)
Admission: RE | Disposition: A | Discharge status: Home / Self Care | Payer: Self-pay | Source: Home / Self Care | Attending: Obstetrics and Gynecology

## 2023-07-09 DIAGNOSIS — Z803 Family history of malignant neoplasm of breast: Secondary | ICD-10-CM | POA: Insufficient documentation

## 2023-07-09 DIAGNOSIS — Z1502 Genetic susceptibility to malignant neoplasm of ovary: Secondary | ICD-10-CM | POA: Insufficient documentation

## 2023-07-09 DIAGNOSIS — N8302 Follicular cyst of left ovary: Secondary | ICD-10-CM | POA: Diagnosis not present

## 2023-07-09 DIAGNOSIS — N8312 Corpus luteum cyst of left ovary: Secondary | ICD-10-CM | POA: Insufficient documentation

## 2023-07-09 DIAGNOSIS — Z1501 Genetic susceptibility to malignant neoplasm of breast: Secondary | ICD-10-CM | POA: Diagnosis not present

## 2023-07-09 DIAGNOSIS — N8301 Follicular cyst of right ovary: Secondary | ICD-10-CM | POA: Insufficient documentation

## 2023-07-09 DIAGNOSIS — Z4002 Encounter for prophylactic removal of ovary: Secondary | ICD-10-CM | POA: Diagnosis present

## 2023-07-09 DIAGNOSIS — Z01818 Encounter for other preprocedural examination: Secondary | ICD-10-CM

## 2023-07-09 DIAGNOSIS — N838 Other noninflammatory disorders of ovary, fallopian tube and broad ligament: Secondary | ICD-10-CM | POA: Insufficient documentation

## 2023-07-09 HISTORY — DX: Presence of spectacles and contact lenses: Z97.3

## 2023-07-09 HISTORY — DX: Malignant (primary) neoplasm, unspecified: C80.1

## 2023-07-09 HISTORY — PX: LAPAROSCOPIC BILATERAL SALPINGO OOPHERECTOMY: SHX5890

## 2023-07-09 HISTORY — DX: Allergic rhinitis, unspecified: J30.9

## 2023-07-09 HISTORY — DX: Anxiety disorder, unspecified: F41.9

## 2023-07-09 HISTORY — DX: Ventricular premature depolarization: I49.3

## 2023-07-09 LAB — CBC
HCT: 40.6 % (ref 36.0–46.0)
Hemoglobin: 13.3 g/dL (ref 12.0–15.0)
MCH: 28.9 pg (ref 26.0–34.0)
MCHC: 32.8 g/dL (ref 30.0–36.0)
MCV: 88.3 fL (ref 80.0–100.0)
Platelets: 327 10*3/uL (ref 150–400)
RBC: 4.6 MIL/uL (ref 3.87–5.11)
RDW: 13.1 % (ref 11.5–15.5)
WBC: 5.3 10*3/uL (ref 4.0–10.5)
nRBC: 0 % (ref 0.0–0.2)

## 2023-07-09 LAB — POCT PREGNANCY, URINE: Preg Test, Ur: NEGATIVE

## 2023-07-09 LAB — TYPE AND SCREEN
ABO/RH(D): AB POS
Antibody Screen: NEGATIVE

## 2023-07-09 LAB — ABO/RH: ABO/RH(D): AB POS

## 2023-07-09 SURGERY — SALPINGO-OOPHORECTOMY, BILATERAL, LAPAROSCOPIC
Anesthesia: General | Site: Abdomen | Laterality: Bilateral

## 2023-07-09 MED ORDER — 0.9 % SODIUM CHLORIDE (POUR BTL) OPTIME
TOPICAL | Status: DC | PRN
  Administered 2023-07-09: 500 mL

## 2023-07-09 MED ORDER — DEXAMETHASONE SODIUM PHOSPHATE 10 MG/ML IJ SOLN
INTRAMUSCULAR | Status: DC | PRN
  Administered 2023-07-09: 10 mg via INTRAVENOUS

## 2023-07-09 MED ORDER — HYDROMORPHONE HCL 1 MG/ML IJ SOLN: 0.2500 mg | INTRAMUSCULAR | Status: DC | PRN

## 2023-07-09 MED ORDER — FENTANYL CITRATE (PF) 100 MCG/2ML IJ SOLN
INTRAMUSCULAR | Status: DC | PRN
  Administered 2023-07-09 (×2): 50 ug via INTRAVENOUS

## 2023-07-09 MED ORDER — SCOPOLAMINE 1 MG/3DAYS TD PT72
MEDICATED_PATCH | TRANSDERMAL | Status: DC | PRN
  Administered 2023-07-09: 1 via TRANSDERMAL

## 2023-07-09 MED ORDER — FENTANYL CITRATE (PF) 100 MCG/2ML IJ SOLN
INTRAMUSCULAR | Status: AC
  Filled 2023-07-09: qty 2

## 2023-07-09 MED ORDER — ROCURONIUM BROMIDE 10 MG/ML (PF) SYRINGE
PREFILLED_SYRINGE | INTRAVENOUS | Status: AC
  Filled 2023-07-09: qty 10

## 2023-07-09 MED ORDER — PROMETHAZINE HCL 25 MG/ML IJ SOLN: 6.2500 mg | INTRAMUSCULAR | Status: DC | PRN

## 2023-07-09 MED ORDER — IBUPROFEN 600 MG PO TABS: 600.0000 mg | ORAL_TABLET | Freq: Four times a day (QID) | ORAL | 0 refills | Status: AC | PRN

## 2023-07-09 MED ORDER — ACETAMINOPHEN 500 MG PO TABS
ORAL_TABLET | ORAL | Status: AC
  Filled 2023-07-09: qty 2

## 2023-07-09 MED ORDER — SUGAMMADEX SODIUM 200 MG/2ML IV SOLN
INTRAVENOUS | Status: DC | PRN
  Administered 2023-07-09: 380 mg via INTRAVENOUS

## 2023-07-09 MED ORDER — LIDOCAINE HCL (PF) 2 % IJ SOLN
INTRAMUSCULAR | Status: AC
  Filled 2023-07-09: qty 5

## 2023-07-09 MED ORDER — LACTATED RINGERS IV SOLN: INTRAVENOUS | Status: DC

## 2023-07-09 MED ORDER — POVIDONE-IODINE 10 % EX SWAB: 2.0000 | Freq: Once | CUTANEOUS | Status: DC

## 2023-07-09 MED ORDER — SCOPOLAMINE 1 MG/3DAYS TD PT72
MEDICATED_PATCH | TRANSDERMAL | Status: AC
  Filled 2023-07-09: qty 1

## 2023-07-09 MED ORDER — KETOROLAC TROMETHAMINE 30 MG/ML IJ SOLN
INTRAMUSCULAR | Status: DC | PRN
  Administered 2023-07-09: 30 mg via INTRAVENOUS

## 2023-07-09 MED ORDER — ONDANSETRON HCL 4 MG/2ML IJ SOLN
INTRAMUSCULAR | Status: AC
  Filled 2023-07-09: qty 2

## 2023-07-09 MED ORDER — DEXMEDETOMIDINE HCL IN NACL 80 MCG/20ML IV SOLN
INTRAVENOUS | Status: DC | PRN
  Administered 2023-07-09: 8 ug via INTRAVENOUS
  Administered 2023-07-09 (×2): 4 ug via INTRAVENOUS

## 2023-07-09 MED ORDER — DEXAMETHASONE SODIUM PHOSPHATE 10 MG/ML IJ SOLN
INTRAMUSCULAR | Status: AC
  Filled 2023-07-09: qty 1

## 2023-07-09 MED ORDER — OXYCODONE HCL 5 MG/5ML PO SOLN: 5.0000 mg | Freq: Once | ORAL | Status: DC | PRN

## 2023-07-09 MED ORDER — MIDAZOLAM HCL 5 MG/5ML IJ SOLN
INTRAMUSCULAR | Status: DC | PRN
  Administered 2023-07-09: 2 mg via INTRAVENOUS

## 2023-07-09 MED ORDER — SODIUM CHLORIDE 0.9 % IR SOLN
Status: DC | PRN
  Administered 2023-07-09: 1000 mL

## 2023-07-09 MED ORDER — MIDAZOLAM HCL 2 MG/2ML IJ SOLN
INTRAMUSCULAR | Status: AC
  Filled 2023-07-09: qty 2

## 2023-07-09 MED ORDER — DEXMEDETOMIDINE HCL IN NACL 80 MCG/20ML IV SOLN
INTRAVENOUS | Status: AC
  Filled 2023-07-09: qty 20

## 2023-07-09 MED ORDER — ONDANSETRON HCL 4 MG/2ML IJ SOLN
INTRAMUSCULAR | Status: DC | PRN
  Administered 2023-07-09: 4 mg via INTRAVENOUS

## 2023-07-09 MED ORDER — KETOROLAC TROMETHAMINE 30 MG/ML IJ SOLN: 30.0000 mg | Freq: Once | INTRAMUSCULAR | Status: DC

## 2023-07-09 MED ORDER — ACETAMINOPHEN 500 MG PO TABS
1000.0000 mg | ORAL_TABLET | ORAL | Status: AC
  Administered 2023-07-09: 1000 mg via ORAL

## 2023-07-09 MED ORDER — OXYCODONE HCL 5 MG PO TABS: 5.0000 mg | ORAL_TABLET | Freq: Once | ORAL | Status: DC | PRN

## 2023-07-09 MED ORDER — BUPIVACAINE HCL (PF) 0.25 % IJ SOLN
INTRAMUSCULAR | Status: DC | PRN
  Administered 2023-07-09: 17 mL

## 2023-07-09 MED ORDER — LIDOCAINE 2% (20 MG/ML) 5 ML SYRINGE
INTRAMUSCULAR | Status: DC | PRN
  Administered 2023-07-09: 100 mg via INTRAVENOUS

## 2023-07-09 MED ORDER — KETOROLAC TROMETHAMINE 30 MG/ML IJ SOLN
INTRAMUSCULAR | Status: AC
  Filled 2023-07-09: qty 1

## 2023-07-09 MED ORDER — AMISULPRIDE (ANTIEMETIC) 5 MG/2ML IV SOLN: 10.0000 mg | Freq: Once | INTRAVENOUS | Status: DC | PRN

## 2023-07-09 MED ORDER — OXYCODONE-ACETAMINOPHEN 5-325 MG PO TABS: 1.0000 | ORAL_TABLET | ORAL | 0 refills | Status: AC | PRN

## 2023-07-09 MED ORDER — PROPOFOL 10 MG/ML IV BOLUS
INTRAVENOUS | Status: DC | PRN
  Administered 2023-07-09: 150 mg via INTRAVENOUS
  Administered 2023-07-09: 50 mg via INTRAVENOUS

## 2023-07-09 MED ORDER — MEPERIDINE HCL 25 MG/ML IJ SOLN: 6.2500 mg | INTRAMUSCULAR | Status: DC | PRN

## 2023-07-09 MED ORDER — PROPOFOL 500 MG/50ML IV EMUL
INTRAVENOUS | Status: AC
  Filled 2023-07-09: qty 50

## 2023-07-09 MED ORDER — ROCURONIUM BROMIDE 10 MG/ML (PF) SYRINGE
PREFILLED_SYRINGE | INTRAVENOUS | Status: DC | PRN
  Administered 2023-07-09: 50 mg via INTRAVENOUS
  Administered 2023-07-09: 30 mg via INTRAVENOUS

## 2023-07-09 SURGICAL SUPPLY — 47 items
ADH SKN CLS APL DERMABOND .7 (GAUZE/BANDAGES/DRESSINGS) ×1
APPLIER CLIP 5 13 M/L LIGAMAX5 (MISCELLANEOUS)
APR CLP MED LRG 5 ANG JAW (MISCELLANEOUS)
CABLE HIGH FREQUENCY MONO STRZ (ELECTRODE) IMPLANT
CATH ROBINSON RED A/P 16FR (CATHETERS) IMPLANT
CLIP APPLIE 5 13 M/L LIGAMAX5 (MISCELLANEOUS) IMPLANT
COVER MAYO STAND STRL (DRAPES) ×1 IMPLANT
DERMABOND ADVANCED .7 DNX12 (GAUZE/BANDAGES/DRESSINGS) ×1 IMPLANT
DRAPE SURG IRRIG POUCH 19X23 (DRAPES) ×1 IMPLANT
DRSG OPSITE POSTOP 3X4 (GAUZE/BANDAGES/DRESSINGS) IMPLANT
DURAPREP 26ML APPLICATOR (WOUND CARE) ×1 IMPLANT
GAUZE 4X4 16PLY ~~LOC~~+RFID DBL (SPONGE) ×2 IMPLANT
GLOVE BIO SURGEON STRL SZ 6.5 (GLOVE) ×2 IMPLANT
GLOVE BIO SURGEON STRL SZ7.5 (GLOVE) IMPLANT
GLOVE BIOGEL PI IND STRL 7.0 (GLOVE) IMPLANT
GLOVE BIOGEL PI IND STRL 7.5 (GLOVE) IMPLANT
GOWN STRL REUS W/ TWL LRG LVL3 (GOWN DISPOSABLE) IMPLANT
GOWN STRL REUS W/TWL LRG LVL3 (GOWN DISPOSABLE) ×4 IMPLANT
IRRIG SUCT STRYKERFLOW 2 WTIP (MISCELLANEOUS) ×1
IRRIGATION SUCT STRKRFLW 2 WTP (MISCELLANEOUS) ×1 IMPLANT
KIT PINK PAD W/HEAD ARE REST (MISCELLANEOUS) ×1
KIT PINK PAD W/HEAD ARM REST (MISCELLANEOUS) ×1 IMPLANT
KIT TURNOVER CYSTO (KITS) ×1 IMPLANT
MANIPULATOR UTERINE 7CM CLEARV (MISCELLANEOUS) ×1 IMPLANT
NS IRRIG 1000ML POUR BTL (IV SOLUTION) ×1 IMPLANT
NS IRRIG 500ML POUR BTL (IV SOLUTION) IMPLANT
PACK LAPAROSCOPY BASIN (CUSTOM PROCEDURE TRAY) ×1 IMPLANT
PROTECTOR NERVE ULNAR (MISCELLANEOUS) ×2 IMPLANT
SET TUBE SMOKE EVAC HIGH FLOW (TUBING) ×1 IMPLANT
SHEARS HARMONIC ACE PLUS 36CM (ENDOMECHANICALS) IMPLANT
SLEEVE SCD COMPRESS KNEE MED (STOCKING) ×1 IMPLANT
SLEEVE Z-THREAD 5X100MM (TROCAR) ×1 IMPLANT
SOL ELECTROSURG ANTI STICK (MISCELLANEOUS) ×1
SOLUTION ELECTROSURG ANTI STCK (MISCELLANEOUS) IMPLANT
SPIKE FLUID TRANSFER (MISCELLANEOUS) ×1 IMPLANT
SUT VIC AB 3-0 PS2 18 (SUTURE) ×1
SUT VIC AB 3-0 PS2 18XBRD (SUTURE) ×1 IMPLANT
SUT VIC AB 4-0 PS2 18 (SUTURE) IMPLANT
SUT VICRYL 0 UR6 27IN ABS (SUTURE) IMPLANT
SYR 30ML LL (SYRINGE) IMPLANT
SYS BAG RETRIEVAL 10MM (BASKET) ×1
SYSTEM BAG RETRIEVAL 10MM (BASKET) IMPLANT
SYSTEM CARTER THOMASON II (TROCAR) IMPLANT
TOWEL OR 17X24 6PK STRL BLUE (TOWEL DISPOSABLE) ×2 IMPLANT
TROCAR Z-THREAD FIOS 11X100 BL (TROCAR) IMPLANT
TROCAR Z-THREAD FIOS 5X100MM (TROCAR) ×2 IMPLANT
WARMER LAPAROSCOPE (MISCELLANEOUS) ×1 IMPLANT

## 2023-07-09 NOTE — Interval H&P Note (Signed)
History and Physical Interval Note: Pt seen. No new changes Confirmed consent To OR when ready   07/09/2023 7:39 AM  Becky Taylor  has presented today for surgery, with the diagnosis of BRCA 2 gene.  The various methods of treatment have been discussed with the patient and family. After consideration of risks, benefits and other options for treatment, the patient has consented to  Procedure(s): LAPAROSCOPIC BILATERAL SALPINGO OOPHORECTOMY (Bilateral) as a surgical intervention.  The patient's history has been reviewed, patient examined, no change in status, stable for surgery.  I have reviewed the patient's chart and labs.  Questions were answered to the patient's satisfaction.     Cathrine Muster

## 2023-07-09 NOTE — Anesthesia Preprocedure Evaluation (Signed)
Anesthesia Evaluation  Patient identified by MRN, date of birth, ID band Patient awake    Reviewed: Allergy & Precautions, H&P , NPO status , Patient's Chart, lab work & pertinent test results  Airway Mallampati: II  TM Distance: >3 FB Neck ROM: Full    Dental no notable dental hx.    Pulmonary neg pulmonary ROS   Pulmonary exam normal breath sounds clear to auscultation       Cardiovascular negative cardio ROS Normal cardiovascular exam Rhythm:Regular Rate:Normal     Neuro/Psych   Anxiety     negative neurological ROS  negative psych ROS   GI/Hepatic negative GI ROS, Neg liver ROS,,,  Endo/Other  negative endocrine ROS    Renal/GU negative Renal ROS  negative genitourinary   Musculoskeletal negative musculoskeletal ROS (+)    Abdominal  (+) + obese  Peds negative pediatric ROS (+)  Hematology negative hematology ROS (+)   Anesthesia Other Findings   Reproductive/Obstetrics negative OB ROS                             Anesthesia Physical Anesthesia Plan  ASA: 2  Anesthesia Plan: General   Post-op Pain Management: Tylenol PO (pre-op)*, Toradol IV (intra-op)* and Dilaudid IV   Induction: Intravenous  PONV Risk Score and Plan: 3 and Ondansetron, Dexamethasone, Midazolam and Treatment may vary due to age or medical condition  Airway Management Planned: Oral ETT  Additional Equipment:   Intra-op Plan:   Post-operative Plan: Extubation in OR  Informed Consent: I have reviewed the patients History and Physical, chart, labs and discussed the procedure including the risks, benefits and alternatives for the proposed anesthesia with the patient or authorized representative who has indicated his/her understanding and acceptance.     Dental advisory given  Plan Discussed with: CRNA  Anesthesia Plan Comments:        Anesthesia Quick Evaluation

## 2023-07-09 NOTE — Anesthesia Postprocedure Evaluation (Signed)
Anesthesia Post Note  Patient: Becky Taylor  Procedure(s) Performed: LAPAROSCOPIC BILATERAL SALPINGO OOPHORECTOMY (Bilateral: Abdomen)     Patient location during evaluation: PACU Anesthesia Type: General Level of consciousness: awake and alert Pain management: pain level controlled Vital Signs Assessment: post-procedure vital signs reviewed and stable Respiratory status: spontaneous breathing, nonlabored ventilation and respiratory function stable Cardiovascular status: blood pressure returned to baseline and stable Postop Assessment: no apparent nausea or vomiting Anesthetic complications: no   No notable events documented.  Last Vitals:  Vitals:   07/09/23 1015 07/09/23 1046  BP: (!) 115/90 112/81  Pulse: 81 74  Resp: 19 16  Temp:  36.9 C  SpO2: 94% 95%    Last Pain:  Vitals:   07/09/23 1046  TempSrc:   PainSc: 0-No pain                 Lowella Curb

## 2023-07-09 NOTE — Discharge Instructions (Addendum)
Call office with any concerns [phone]  DISCHARGE INSTRUCTIONS: Laparoscopy  The following instructions have been prepared to help you care for yourself upon your return home today.  Wound care:  Do not get the incision wet for the first 24 hours. The incision should be kept clean and dry.  The Band-Aids or dressings may be removed the day after surgery.  Should the incision become sore, red, and swollen after the first week, check with your doctor.  Personal hygiene:  Shower the day after your procedure. Use a sanitary pad or liner only for vaginal bleeding/drainage until after your post-op visit.  Activity and limitations:  Do NOT drive or operate any equipment today.  Do NOT lift anything more than 15 pounds for 2-3 weeks after surgery.  Do NOT rest in bed all day.  Walking is encouraged. Walk each day, starting slowly with 5-minute walks 3 or 4 times a day. Slowly increase the length of your walks.  Walk up and down stairs slowly.  Do NOT do strenuous activities, such as golfing, playing tennis, bowling, running, biking, weight lifting, gardening, mowing, or vacuuming for 2-4 weeks. Ask your doctor when it is okay to start.  Diet: Eat a light meal as desired this evening. You may resume your usual diet tomorrow.  Return to work: This is dependent on the type of work you do. For the most part you can return to a desk job within a week of surgery. If you are more active at work, please discuss this with your doctor.  What to expect after your surgery: You may have a slight burning sensation when you urinate on the first day. You may have a very small amount of blood in the urine. Expect to have a small amount of vaginal discharge/light bleeding for 1-2 weeks. It is not unusual to have abdominal soreness and bruising for up to 2 weeks. You may be tired and need more rest for about 1 week. You may experience shoulder pain for 24-72 hours. Lying flat in bed may relieve it.  Call  your doctor for any of the following:  Develop a fever of 100.4 or greater  Inability to urinate 6 hours after discharge from hospital  Severe pain not relieved by pain medications  Persistent of heavy bleeding at incision site  Redness or swelling around incision site after a week  Increasing nausea or vomiting   Post Anesthesia Home Care Instructions  Activity: Get plenty of rest for the remainder of the day. A responsible individual must stay with you for 24 hours following the procedure.  For the next 24 hours, DO NOT: -Drive a car -Advertising copywriter -Drink alcoholic beverages -Take any medication unless instructed by your physician -Make any legal decisions or sign important papers.  Meals: Start with liquid foods such as gelatin or soup. Progress to regular foods as tolerated. Avoid greasy, spicy, heavy foods. If nausea and/or vomiting occur, drink only clear liquids until the nausea and/or vomiting subsides. Call your physician if vomiting continues.  Special Instructions/Symptoms: Your throat may feel dry or sore from the anesthesia or the breathing tube placed in your throat during surgery. If this causes discomfort, gargle with warm salt water. The discomfort should disappear within 24 hours.  If you had a scopolamine patch placed behind your ear for the management of post- operative nausea and/or vomiting:  1. The medication in the patch is effective for 72 hours, after which it should be removed.  Wrap patch  in a tissue and discard in the trash. Wash hands thoroughly with soap and water. 2. You may remove the patch earlier than 72 hours if you experience unpleasant side effects which may include dry mouth, dizziness or visual disturbances. 3. Avoid touching the patch. Wash your hands with soap and water after contact with the patch. Remove patch by Monday, 7/29    No acetaminophen/Tylenol until after 12:30 pm today if needed. No ibuprofen, Advil, Aleve, Motrin,  ketorolac, meloxicam, naproxen, or other NSAIDS until after 2 pm today if needed.

## 2023-07-09 NOTE — Anesthesia Procedure Notes (Signed)
Procedure Name: Intubation Date/Time: 07/09/2023 8:00 AM  Performed by: Lowella Curb, MDPre-anesthesia Checklist: Patient identified, Emergency Drugs available, Suction available, Patient being monitored and Timeout performed Patient Re-evaluated:Patient Re-evaluated prior to induction Oxygen Delivery Method: Circle system utilized Preoxygenation: Pre-oxygenation with 100% oxygen Induction Type: IV induction Ventilation: Mask ventilation with difficulty, Two handed mask ventilation required and Oral airway inserted - appropriate to patient size Laryngoscope Size: Mac and 3 Grade View: Grade III Tube type: Oral Tube size: 7.0 mm Number of attempts: 4 Airway Equipment and Method: Bougie stylet and Video-laryngoscopy Placement Confirmation: positive ETCO2 and breath sounds checked- equal and bilateral Secured at: 21 cm Tube secured with: Tape Dental Injury: Teeth and Oropharynx as per pre-operative assessment  Difficulty Due To: Difficult Airway- due to large tongue, Difficult Airway- due to reduced neck mobility, Difficult Airway- due to anterior larynx and Difficult Airway- due to limited oral opening Future Recommendations: Recommend- induction with short-acting agent, and alternative techniques readily available Comments: Initial mask ventilation difficult but improvement with oral airway and two handed technique.  Grade 3 view with Mac 3.  Unable to visualize cords or intubate.  Attempt with glidescope X 2 with cords visualized but unable to get tube through anterior cords.  Mask ventilation became more difficult ultimately requiring LMA insertion with normalization of O2 saturation.  Intubation successful with Mac 3 and bougie.

## 2023-07-09 NOTE — Transfer of Care (Addendum)
Immediate Anesthesia Transfer of Care Note  Patient: Becky Taylor  Procedure(s) Performed: LAPAROSCOPIC BILATERAL SALPINGO OOPHORECTOMY (Bilateral: Abdomen)  Patient Location: PACU  Anesthesia Type:General  Level of Consciousness: awake, alert , and oriented  Airway & Oxygen Therapy: Patient Spontanous Breathing and Patient connected to nasal cannula oxygen  Post-op Assessment: Report given to RN  Post vital signs: Reviewed and stable  Last Vitals:  Vitals Value Taken Time  BP 108/76 07/09/23 0943  Temp 37 C 07/09/23 0943  Pulse 87 07/09/23 0944  Resp 16 07/09/23 0944  SpO2 95 % 07/09/23 0944  Vitals shown include unfiled device data.  Last Pain:  Vitals:   07/09/23 0600  TempSrc: Oral      Patients Stated Pain Goal: 5 (07/09/23 8413)  Complications: pt with unanticipated difficult airway,

## 2023-07-09 NOTE — Op Note (Signed)
Operative Note    Preoperative Diagnosis BRCA+   Postoperative Diagnosis: Same   Procedure: Laparoscopic bilateral salpingo-oopherectomy    Surgeon: Britt Bottom, DO  Assist - RNFA   Anesthesia: General   Fluids: LR 1L EBL: 25ml UOP: voided prior to OR    Findings: Grossly normal uterus, both fallopian tubes and ovaries adherent to lateral sidewalls but otherwise grossly normal    Specimen: Bilateral fallopian tubes and ovaries    Procedure Note  Consent reviewed and confirmed in periop area.  Patient was taken to the operating room where she was comfortable placed in the dorsal lithotomy position.  General anesthesia was administered with some difficulty. She was then prepped and draped in the normal sterile fashion. An appropriate timeout was performed.  A speculum was placed vaginally and cervix identified. A single toothed tenaculum was used to grasp the anterior lip of the cervix. A ClearVue uterine manipulator was placed.  Legs were lowered and attention was then turned to the patient's abdomen.  A 5 mm infraumbilical incision was made after 0.25% marcaine injected and the optiview trocar and camera easily introduced into the abdominal cavity.Gas flow was then applied and a pneumoperitoneum obtained with approximate 3 L of CO2 gas. A second and third trocar were placed in the patients right,then left, lower uterine segment under visualization, and a better survey obtained.    With patient in trendelenburg position,  the pelvis was surveyed. Findings as below :    The harmonic scalpel was then used to excise the right, and then left fallopian tubes and ovaries which were adherent to the lateral sidewall.  Hemostasis was noted.  The 5mm umbilical trocar was replaced with an 11mm trocar. A 10cm bag was then inserted and the tube and ovaries placed carefully in it. They were then removed. Irrigation performed. No bleeding noted.  The 11cm trocar was then removed and the  incision closed using a carter thomasen device.  The remaining trocars were then removed after gas allowed to escape.  The skin incisions were closed with 4-0 vicryl and dermabond  Patient was then awakened and taken to the recovery room in stable condition. Counts were all confirmed accurate

## 2023-07-12 ENCOUNTER — Encounter (HOSPITAL_BASED_OUTPATIENT_CLINIC_OR_DEPARTMENT_OTHER): Payer: Self-pay | Admitting: Obstetrics and Gynecology

## 2023-07-26 ENCOUNTER — Encounter: Payer: Self-pay | Admitting: Family Medicine

## 2023-07-26 ENCOUNTER — Ambulatory Visit (INDEPENDENT_AMBULATORY_CARE_PROVIDER_SITE_OTHER): Payer: BC Managed Care – PPO | Admitting: Family Medicine

## 2023-07-26 VITALS — BP 136/86 | HR 83 | Temp 97.6°F | Ht 68.0 in | Wt 264.0 lb

## 2023-07-26 DIAGNOSIS — R0683 Snoring: Secondary | ICD-10-CM

## 2023-07-26 DIAGNOSIS — G250 Essential tremor: Secondary | ICD-10-CM | POA: Diagnosis not present

## 2023-07-26 DIAGNOSIS — Z0000 Encounter for general adult medical examination without abnormal findings: Secondary | ICD-10-CM

## 2023-07-26 DIAGNOSIS — Z13220 Encounter for screening for lipoid disorders: Secondary | ICD-10-CM

## 2023-07-26 DIAGNOSIS — R202 Paresthesia of skin: Secondary | ICD-10-CM | POA: Diagnosis not present

## 2023-07-26 DIAGNOSIS — R030 Elevated blood-pressure reading, without diagnosis of hypertension: Secondary | ICD-10-CM

## 2023-07-26 DIAGNOSIS — Z0001 Encounter for general adult medical examination with abnormal findings: Secondary | ICD-10-CM

## 2023-07-26 DIAGNOSIS — D126 Benign neoplasm of colon, unspecified: Secondary | ICD-10-CM

## 2023-07-26 DIAGNOSIS — Z1501 Genetic susceptibility to malignant neoplasm of breast: Secondary | ICD-10-CM

## 2023-07-26 LAB — LIPID PANEL
Cholesterol: 211 mg/dL — ABNORMAL HIGH (ref 0–200)
HDL: 55.6 mg/dL (ref >39.00)
LDL Cholesterol: 138 mg/dL — ABNORMAL HIGH (ref 0–99)
NonHDL: 155.12
Total CHOL/HDL Ratio: 4
Triglycerides: 86 mg/dL (ref 0.0–149.0)
VLDL: 17.2 mg/dL (ref 0.0–40.0)

## 2023-07-26 LAB — COMPREHENSIVE METABOLIC PANEL
ALT: 13 U/L (ref 0–35)
AST: 15 U/L (ref 0–37)
Albumin: 4 g/dL (ref 3.5–5.2)
Alkaline Phosphatase: 59 U/L (ref 39–117)
BUN: 11 mg/dL (ref 6–23)
CO2: 24 mEq/L (ref 19–32)
Calcium: 9.2 mg/dL (ref 8.4–10.5)
Chloride: 104 mEq/L (ref 96–112)
Creatinine, Ser: 0.69 mg/dL (ref 0.40–1.20)
GFR: 99.76 mL/min (ref >60.00)
Glucose, Bld: 100 mg/dL — ABNORMAL HIGH (ref 70–99)
Potassium: 3.9 mEq/L (ref 3.5–5.1)
Sodium: 138 mEq/L (ref 135–145)
Total Bilirubin: 0.4 mg/dL (ref 0.2–1.2)
Total Protein: 7.4 g/dL (ref 6.0–8.3)

## 2023-07-26 LAB — VITAMIN B12: Vitamin B-12: 206 pg/mL — ABNORMAL LOW (ref 211–911)

## 2023-07-26 LAB — VITAMIN D 25 HYDROXY (VIT D DEFICIENCY, FRACTURES): VITD: 22.93 ng/mL — ABNORMAL LOW (ref 30.00–100.00)

## 2023-07-26 LAB — TSH: TSH: 2.24 u[IU]/mL (ref 0.35–5.50)

## 2023-07-26 NOTE — Progress Notes (Signed)
Subjective  Chief Complaint  Patient presents with   Annual Exam    HPI: Becky Taylor is a 52 y.o. female who presents to [facility] [place] today for a Female Wellness Visit. She also has the concerns and/or needs as listed above in the chief complaint. These will be addressed in addition to the Health Maintenance Visit.   Wellness Visit: annual visit with health maintenance review and exam without Pap  HM: s/p BSO w/ Dr. Mindi Slicker, prophylactic due to BRCA +, getting mammo and MRI for surveillance; daughters not yet tested... will consider testing in future. Pap due next year with Dr. Mindi Slicker. Colonoscopy 10/23: tubular adenoma and mild tics; repeat in 7 yr. Has gained weight: stressful year dealing with parental's ailments. Ready to start eating better and exercising again. Has used weight watchers in past successfully. Inquires about vitamin supplements; regular diet Chronic disease f/u and/or acute problem visit: (deemed necessary to be done in addition to the wellness visit): Snoring: also bruxism; admits to day somnolence, awakens tired, snores and can fall asleep easily. No orthopnea. ? Sleep apnea. Bp is borderline high today as well.  BRCA: screens are current Essential tremor: at times bothersome but not needing treatment yet C/o left leg "symptoms": feels jittery, or weak intermittently w/o joint pain. Has tingling sxs down back of leg w/o back pain. No b/b sxs. No give way. No ataxia or balance problems. Also c/o hands feeling more "jittery" at times.   Assessment  1. Annual physical exam   2. Tubular adenoma of colon   3. BRCA gene mutation positive   4. Paresthesias   5. Essential tremor   6. Morbid obesity (HCC)   7. Snoring   8. Elevated blood pressure reading without diagnosis of hypertension      Plan  Female Wellness Visit: Age appropriate Health Maintenance and Prevention measures were discussed with patient. Included topics are cancer  screening recommendations, ways to keep healthy (see AVS) including dietary and exercise recommendations, regular eye and dental care, use of seat belts, and avoidance of moderate alcohol use and tobacco use. Screens current BMI: discussed patient's BMI and encouraged positive lifestyle modifications to help get to or maintain a target BMI. HM needs and immunizations were addressed and ordered. See below for orders. See HM and immunization section for updates. Routine labs and screening tests ordered including cmp, cbc and lipids where appropriate. Discussed recommendations regarding Vit D and calcium supplementation (see AVS)  Chronic disease management visit and/or acute problem visit: BRCA: s/p BSO and recovering well. Discuss menopausal sxs. Morbid obesity: discussed weight loss and exercise.  Essential tremor: fine now. Avoid caffeine. Monitor stress.  Paresthesias: vague, start with lab work and monitor. Start exercise, strength and balance training and back exercises. To return for further evaluation if persist or worsen.  Snoring and fatigue and obesity: refer to consider sleep study.  Monitor bp.  Follow up: 12 mo for cpe  Orders Placed This Encounter  Procedures   VITAMIN D 25 Hydroxy (Vit-D Deficiency, Fractures)   Vitamin B12   Comprehensive metabolic panel   Lipid panel   TSH   CBC with Differential/Platelet   Ambulatory referral to Pulmonology   No orders of the defined types were placed in this encounter.     Body mass index is 40.14 kg/m. Wt Readings from Last 3 Encounters:  07/26/23 264 lb (119.7 kg)  07/09/23 261 lb 1.6 oz (118.4 kg)  09/23/22 248 lb (112.5 kg)  Patient Active Problem List   Diagnosis Date Noted Date Diagnosed   Tubular adenoma of colon 07/26/2023     Priority: High    Colonoscopy Dr. Rhea Belton, 09/2022, repeat in 7 years    Family history of BRCA gene mutation 02/20/2021     Priority: High   BRCA gene mutation positive 09/20/2019      Priority: High    Prophylactic BSO 06/2023, Dr. 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 Antoine Poche: nl stress echo (previously) and heart monitor with PVCs. asymptomatic    Seasonal allergic rhinitis due to pollen 07/22/2022     Priority: Low   Essential tremor 10/24/2019     Priority: Low   Health Maintenance  Topic Date Due   COVID-19 Vaccine (4 - 2023-24 season) 08/11/2023 (Originally 08/14/2022)   INFLUENZA VACCINE  09/28/2023 (Originally 07/15/2023)   MAMMOGRAM  02/15/2024   PAP SMEAR-Modifier  07/16/2024   Colonoscopy  09/23/2029   DTaP/Tdap/Td (2 - Td or Tdap) 10/23/2029   Hepatitis C Screening  Completed   HIV Screening  Completed   Zoster Vaccines- Shingrix  Completed   HPV VACCINES  Aged Out   Immunization History  Administered Date(s) Administered   Influenza Inj Mdck Quad Pf 11/07/2019   Influenza,inj,Quad PF,6-35 Mos 09/13/2020   PFIZER(Purple Top)SARS-COV-2 Vaccination 02/16/2020, 03/20/2020, 12/04/2020   Tdap 10/24/2019   Zoster Recombinant(Shingrix) 03/10/2021, 11/11/2021   We updated and reviewed the patient's past history in detail and it is documented below. Allergies: Patient is allergic to other. Past Medical History Patient  has a past medical history of Allergic rhinitis, Anxiety, basal cell carcinoma, BRCA2 gene mutation positive, Eosinophilic esophagitis, Family history of breast cancer, history of Frequent PVCs (2013), Obesity, Palpitations, and Wears contact lenses. Past Surgical History Patient  has a past surgical history that includes Adenoidectomy; Breast biopsy (Right, 10/24/2021); Upper gastrointestinal endoscopy; basal cell carcimona removed (2018); and Laparoscopic bilateral salpingo oophorectomy (Bilateral, 07/09/2023). Family  History: Patient family history includes Arthritis in her paternal grandmother; Asthma in her paternal grandmother; BRCA 1/2 in her mother and sister; Breast cancer (age of onset: 73) in her mother; Cancer in her maternal grandfather and paternal grandfather; Healthy in her sister; Hearing loss in her paternal grandfather and paternal grandmother; Heart attack in her maternal grandfather; Heart disease in her maternal grandfather; High Cholesterol in her maternal grandmother; Hyperlipidemia in her mother; Hypertension in her mother; Neurologic Disorder in her father; Stroke in her maternal grandmother. Social History:  Patient  reports that she has never smoked. She has never used smokeless tobacco. She reports current alcohol use. She reports that she does not use drugs.  Review of Systems: Constitutional: negative for fever or malaise Ophthalmic: negative for photophobia, double vision or loss of vision Cardiovascular: negative for chest pain, dyspnea on exertion, or new LE swelling Respiratory: negative for SOB or persistent cough Gastrointestinal: negative for abdominal pain, change in bowel habits or melena Genitourinary: negative for dysuria or gross hematuria, no abnormal uterine bleeding or disharge Musculoskeletal: negative for new gait disturbance or muscular weakness Integumentary: negative for new or persistent rashes, no breast lumps Neurological: negative for TIA or stroke symptoms Psychiatric: negative for SI or delusions Allergic/Immunologic: negative  for hives  Patient Care Team    Relationship Specialty Notifications Start End  Willow Ora, MD PCP - General Family Medicine  09/20/19   Edwinna Areola, DO Consulting Physician Obstetrics and Gynecology  09/20/19   Rollene Rotunda, MD Consulting Physician Cardiology  07/22/22   Beverley Fiedler, MD Consulting Physician Gastroenterology  07/26/23     Objective  Vitals: BP 136/86   Pulse 83   Temp 97.6 F (36.4 C)   Ht  5\' 8"  (1.727 m)   Wt 264 lb (119.7 kg)   LMP 06/03/2023   SpO2 95%   BMI 40.14 kg/m  General:  Well developed, well nourished, no acute distress  Psych:  Alert and orientedx3,normal mood and affect HEENT:  Normocephalic, atraumatic, non-icteric sclera,  supple neck without adenopathy, mass or thyromegaly Cardiovascular:  Normal S1, S2, RRR without gallop, rub or murmur Respiratory:  Good breath sounds bilaterally, CTAB with normal respiratory effort Gastrointestinal: normal bowel sounds, soft, non-tender, no noted masses. No HSM MSK: extremities without edema, joints without erythema or swelling Neurologic:    Mental status is normal.  Gross motor and sensory exams are normal.  No tremor  Commons side effects, risks, benefits, and alternatives for medications and treatment plan prescribed today were discussed, and the patient expressed understanding of the given instructions. Patient is instructed to call or message via MyChart if he/she has any questions or concerns regarding our treatment plan. No barriers to understanding were identified. We discussed Red Flag symptoms and signs in detail. Patient expressed understanding regarding what to do in case of urgent or emergency type symptoms.  Medication list was reconciled, printed and provided to the patient in AVS. Patient instructions and summary information was reviewed with the patient as documented in the AVS. This note was prepared with assistance of Dragon voice recognition software. Occasional wrong-word or sound-a-like substitutions may have occurred due to the inherent limitations of voice recognition software

## 2023-07-26 NOTE — Patient Instructions (Signed)
Please return in 12 months for your annual complete physical; please come fasting. Sooner if need more help with leg symptoms.  I will release your lab results to you on your MyChart account with further instructions. You may see the results before I do, but when I review them I will send you a message with my report or have my assistant call you if things need to be discussed. Please reply to my message with any questions. Thank you!   We will call you with information regarding your referral appointment. [facility] for sleep study evaluation. If you do not hear from [place] within the next 2 weeks, please let me know. It can take 1-2 weeks to get appointments set up with the specialists.   Start strength and balance training at home :)   If you have any questions or concerns, please don't hesitate to send me a message via MyChart or call the office at [phone]. Thank you for visiting with [place] today! It's our pleasure caring for you.   Calcium Intake Recommendations You can take Caltrate Plus twice a day or get it through your diet or other OTC supplements (Viactiv, OsCal etc)  Calcium is a mineral that affects many functions in the body, including: Blood clotting. Blood vessel function. Nerve impulse conduction. Hormone secretion. Muscle contraction. Bone and teeth functions.  Most of your body's calcium supply is stored in your bones and teeth. When your calcium stores are low, you may be at risk for low bone mass, bone loss, and bone fractures. Consuming enough calcium helps to grow healthy bones and teeth and to prevent breakdown over time. It is very important that you get enough calcium if you are: A child undergoing rapid growth. An adolescent girl. A pre- or post-menopausal woman. A woman whose menstrual cycle has stopped due to anorexia nervosa or regular intense exercise. An individual with lactose intolerance or a milk allergy. A vegetarian.  What is my plan? Try to  consume the recommended amount of calcium daily based on your age. Depending on your overall health, your health care provider may recommend increased calcium intake. General daily calcium intake recommendations by age are: Birth to 6 months: 200 mg. Infants 7 to 12 months: 260 mg. Children 1 to 3 years: 700 mg. Children 4 to 8 years: 1,000 mg. Children 9 to 13 years: 1,300 mg. Teens 14 to 18 years: 1,300 mg. Adults 19 to 50 years: 1,000 mg. Adult women 51 to 70 years: 1,200 mg. Adult men 51 to 70 years: 1,000 mg. Adults 71 years and older: 1,200 mg. Pregnant and breastfeeding teens: 1,300 mg. Pregnant and breastfeeding adults: 1,000 mg.  What do I need to know about calcium intake? In order for the body to absorb calcium, it needs vitamin D. You can get vitamin D through (we recommend getting [id number] units of Vitamin D daily) Direct exposure of the skin to sunlight. Foods, such as egg yolks, liver, saltwater fish, and fortified milk. Supplements. Consuming too much calcium may cause: Constipation. Decreased absorption of iron and zinc. Kidney stones. Calcium supplements may interact with certain medicines. Check with your health care provider before starting any calcium supplements. Try to get most of your calcium from food. What foods can I eat? Grains  Fortified oatmeal. Fortified ready-to-eat cereals. Fortified frozen waffles. Vegetables Turnip greens. Broccoli. Fruits Fortified orange juice. Meats and Other Protein Sources Canned sardines with bones. Canned salmon with bones. Soy beans. Tofu. Baked beans. Almonds. [place] nuts. Sunflower seeds.  Dairy Milk. Yogurt. Cheese. Cottage cheese. Beverages Fortified soy milk. Fortified rice milk. Sweets/Desserts Pudding. Ice Cream. Milkshakes. Blackstrap molasses. The items listed above may not be a complete list of recommended foods or beverages. Contact your dietitian for more options. What foods can affect my calcium  intake? It may be more difficult for your body to use calcium or calcium may leave your body more quickly if you consume large amounts of: Sodium. Protein. Caffeine. Alcohol.  This information is not intended to replace advice given to you by your health care provider. Make sure you discuss any questions you have with your health care provider. Document Released: 07/14/2004 Document Revised: 06/19/2016 Document Reviewed: 05/08/2014 Elsevier Interactive Patient Education  2018 [employer].

## 2023-08-03 ENCOUNTER — Other Ambulatory Visit: Payer: Self-pay | Admitting: Obstetrics and Gynecology

## 2023-08-03 DIAGNOSIS — Z1501 Genetic susceptibility to malignant neoplasm of breast: Secondary | ICD-10-CM

## 2023-08-27 ENCOUNTER — Other Ambulatory Visit: Payer: Self-pay

## 2023-08-27 ENCOUNTER — Ambulatory Visit (INDEPENDENT_AMBULATORY_CARE_PROVIDER_SITE_OTHER): Payer: BC Managed Care – PPO

## 2023-08-27 ENCOUNTER — Ambulatory Visit: Payer: BC Managed Care – PPO | Admitting: Podiatry

## 2023-08-27 ENCOUNTER — Encounter: Payer: Self-pay | Admitting: Podiatry

## 2023-08-27 DIAGNOSIS — M79672 Pain in left foot: Secondary | ICD-10-CM | POA: Diagnosis not present

## 2023-08-27 DIAGNOSIS — M84375A Stress fracture, left foot, initial encounter for fracture: Secondary | ICD-10-CM | POA: Diagnosis not present

## 2023-08-27 NOTE — Progress Notes (Signed)
Subjective:  Patient ID: Becky Taylor, female    DOB: 1970-12-15,   MRN: [id number]  No chief complaint on file.   52 y.o. female presents for concern of left foot pain that has been going on for about a month. Relates that she is active and walks. She relates some discomfort when walking but most pain in the evening and will have throbbing. Denies burning and tingling.  Denies any other pedal complaints. Denies n/v/f/c.   Past Medical History:  Diagnosis Date   Allergic rhinitis    Anxiety    basal cell carcinoma    BRCA2 gene mutation positive    Eosinophilic esophagitis    Hx of   Family history of breast cancer    history of Frequent PVCs 2013   Obesity    Palpitations    Wears contact lenses     Objective:  Physical Exam: Vascular: DP/PT pulses 2/4 bilateral. CFT <3 seconds. Normal hair growth on digits. No edema.  Skin. No lacerations or abrasions bilateral feet.  Musculoskeletal: MMT 5/5 bilateral lower extremities in DF, PF, Inversion and Eversion. Deceased ROM in DF of ankle joint. Tender to the lateral fifth metatarsal area around the diaphysis. Some discomfort with vibratory test. No pain along peroneals and no pain in fifth digit. No pain around ankle area.  Neurological: Sensation intact to light touch.   Assessment:   1. Stress reaction of left foot, initial encounter      Plan:  Patient was evaluated and treated and all questions answered. -Xrays reviewed. No acute fractures or dislocations noted. No major periosteal reaction snoted.  -Discussed treatement options for stress reaction. ; risks, alternatives, and benefits explained. -Dispensed CAM boot. Patient to wear at all times and instructed on use -Recommend protection, rest, ice, elevation daily until symptoms improve -Rx pain med/antinflammatories as needed -Patient to return to office in 4 weeks for serial x-rays to assess healing  or sooner if condition worsens.   Louann Sjogren, DPM

## 2023-09-20 ENCOUNTER — Ambulatory Visit
Admission: RE | Admit: 2023-09-20 | Discharge: 2023-09-20 | Disposition: A | Discharge status: Home / Self Care | Payer: BC Managed Care – PPO | Source: Ambulatory Visit | Attending: Obstetrics and Gynecology | Admitting: Obstetrics and Gynecology

## 2023-09-20 DIAGNOSIS — Z1501 Genetic susceptibility to malignant neoplasm of breast: Secondary | ICD-10-CM

## 2023-09-20 MED ORDER — GADOPICLENOL 0.5 MMOL/ML IV SOLN
10.0000 mL | Freq: Once | INTRAVENOUS | Status: AC | PRN
  Administered 2023-09-20: 10 mL via INTRAVENOUS

## 2023-09-23 ENCOUNTER — Ambulatory Visit: Payer: BC Managed Care – PPO | Admitting: Podiatry

## 2023-10-21 ENCOUNTER — Ambulatory Visit: Payer: BC Managed Care – PPO | Admitting: Podiatry

## 2024-07-27 ENCOUNTER — Ambulatory Visit (INDEPENDENT_AMBULATORY_CARE_PROVIDER_SITE_OTHER): Payer: BC Managed Care – PPO | Admitting: Family Medicine

## 2024-07-27 ENCOUNTER — Encounter: Payer: Self-pay | Admitting: Family Medicine

## 2024-07-27 ENCOUNTER — Encounter: Payer: BC Managed Care – PPO | Admitting: Family Medicine

## 2024-07-27 VITALS — BP 125/84 | HR 75 | Temp 97.7°F | Ht 68.0 in | Wt 276.2 lb

## 2024-07-27 DIAGNOSIS — Z0000 Encounter for general adult medical examination without abnormal findings: Secondary | ICD-10-CM | POA: Diagnosis not present

## 2024-07-27 DIAGNOSIS — R0683 Snoring: Secondary | ICD-10-CM | POA: Insufficient documentation

## 2024-07-27 DIAGNOSIS — E538 Deficiency of other specified B group vitamins: Secondary | ICD-10-CM | POA: Diagnosis not present

## 2024-07-27 DIAGNOSIS — Z1509 Genetic susceptibility to other malignant neoplasm: Secondary | ICD-10-CM

## 2024-07-27 DIAGNOSIS — D126 Benign neoplasm of colon, unspecified: Secondary | ICD-10-CM | POA: Diagnosis not present

## 2024-07-27 DIAGNOSIS — E559 Vitamin D deficiency, unspecified: Secondary | ICD-10-CM | POA: Diagnosis not present

## 2024-07-27 DIAGNOSIS — Z1501 Genetic susceptibility to malignant neoplasm of breast: Secondary | ICD-10-CM | POA: Diagnosis not present

## 2024-07-27 DIAGNOSIS — G250 Essential tremor: Secondary | ICD-10-CM

## 2024-07-27 DIAGNOSIS — R5383 Other fatigue: Secondary | ICD-10-CM

## 2024-07-27 DIAGNOSIS — K200 Eosinophilic esophagitis: Secondary | ICD-10-CM | POA: Insufficient documentation

## 2024-07-27 DIAGNOSIS — N951 Menopausal and female climacteric states: Secondary | ICD-10-CM

## 2024-07-27 DIAGNOSIS — Z0001 Encounter for general adult medical examination with abnormal findings: Secondary | ICD-10-CM

## 2024-07-27 LAB — HEMOGLOBIN A1C: Hgb A1c MFr Bld: 5.8 % (ref 4.6–6.5)

## 2024-07-27 LAB — CBC WITH DIFFERENTIAL/PLATELET
Basophils Absolute: 0.1 K/uL (ref 0.0–0.1)
Basophils Relative: 1.4 % (ref 0.0–3.0)
Eosinophils Absolute: 0.5 K/uL (ref 0.0–0.7)
Eosinophils Relative: 9.5 % — ABNORMAL HIGH (ref 0.0–5.0)
HCT: 40.6 % (ref 36.0–46.0)
Hemoglobin: 13.4 g/dL (ref 12.0–15.0)
Lymphocytes Relative: 25.7 % (ref 12.0–46.0)
Lymphs Abs: 1.2 K/uL (ref 0.7–4.0)
MCHC: 32.9 g/dL (ref 30.0–36.0)
MCV: 85.1 fl (ref 78.0–100.0)
Monocytes Absolute: 0.3 K/uL (ref 0.1–1.0)
Monocytes Relative: 6.8 % (ref 3.0–12.0)
Neutro Abs: 2.7 K/uL (ref 1.4–7.7)
Neutrophils Relative %: 56.6 % (ref 43.0–77.0)
Platelets: 282 K/uL (ref 150.0–400.0)
RBC: 4.78 Mil/uL (ref 3.87–5.11)
RDW: 14.5 % (ref 11.5–15.5)
WBC: 4.8 K/uL (ref 4.0–10.5)

## 2024-07-27 LAB — COMPREHENSIVE METABOLIC PANEL WITH GFR
ALT: 13 U/L (ref 0–35)
AST: 16 U/L (ref 0–37)
Albumin: 4 g/dL (ref 3.5–5.2)
Alkaline Phosphatase: 68 U/L (ref 39–117)
BUN: 11 mg/dL (ref 6–23)
CO2: 28 meq/L (ref 19–32)
Calcium: 9 mg/dL (ref 8.4–10.5)
Chloride: 106 meq/L (ref 96–112)
Creatinine, Ser: 0.72 mg/dL (ref 0.40–1.20)
GFR: 95.43 mL/min (ref >60.00)
Glucose, Bld: 93 mg/dL (ref 70–99)
Potassium: 4.2 meq/L (ref 3.5–5.1)
Sodium: 141 meq/L (ref 135–145)
Total Bilirubin: 0.5 mg/dL (ref 0.2–1.2)
Total Protein: 7.3 g/dL (ref 6.0–8.3)

## 2024-07-27 LAB — LIPID PANEL
Cholesterol: 202 mg/dL — ABNORMAL HIGH (ref 0–200)
HDL: 56.5 mg/dL (ref >39.00)
LDL Cholesterol: 125 mg/dL — ABNORMAL HIGH (ref 0–99)
NonHDL: 145.41
Total CHOL/HDL Ratio: 4
Triglycerides: 102 mg/dL (ref 0.0–149.0)
VLDL: 20.4 mg/dL (ref 0.0–40.0)

## 2024-07-27 LAB — VITAMIN D 25 HYDROXY (VIT D DEFICIENCY, FRACTURES): VITD: 19.89 ng/mL — ABNORMAL LOW (ref 30.00–100.00)

## 2024-07-27 LAB — TSH: TSH: 2.43 u[IU]/mL (ref 0.35–5.50)

## 2024-07-27 LAB — VITAMIN B12: Vitamin B-12: 329 pg/mL (ref 211–911)

## 2024-07-27 NOTE — Patient Instructions (Signed)
 Please return in 12 months for your annual complete physical; please come fasting.   I will release your lab results to you on your MyChart account with further instructions. You may see the results before I do, but when I review them I will send you a message with my report or have my assistant call you if things need to be discussed. Please reply to my message with any questions. Thank you!   If you have any questions or concerns, please don't hesitate to send me a message via MyChart or call the office at [phone]. Thank you for visiting with us  today! It's our pleasure caring for you.   We will call you with information regarding your referral appointment. [facility] to discuss a sleep study.  If you do not hear from us  within the next 2 weeks, please let me know. It can take 1-2 weeks to get appointments set up with the specialists.    Let me know if you need more help with hip or knee pain or sleep/fatigue.

## 2024-07-27 NOTE — Progress Notes (Signed)
 Subjective  Chief Complaint  Patient presents with   Annual Exam    Pt here for Annual Exam and is currently fasting     HPI: Becky Taylor is a 53 y.o. female who presents to [facility] [place] today for a Female Wellness Visit. She also has the concerns and/or needs as listed above in the chief complaint. These will be addressed in addition to the Health Maintenance Visit.   Wellness Visit: annual visit with health maintenance review and exam  HM: sees Dr. Delana, last 2024. Pap and mammo are current. Last pap verified 2024 nl w/ neg HPV. CRC is current with Dr. Albertus; tubular adenoma with q 7 year recall. Has had a long year; has regained some weight. Busy with full time career, young adult children and aging parents. Eligible for prevnar.  Chronic disease f/u and/or acute problem visit: (deemed necessary to be done in addition to the wellness visit): C/o poor sleep, daily fatigue, snoring. Never feels rested. Can fall asleep if sits down. Has never been tested for sleep apnea. Has risk factors.  Obesity: does well if can stick to diet but hasn't been strict. No time for exercise. Would be open to medications. Not covered by her insurance at this time for weight mgt.  Some hot flushes, brain fog since oopherectomy. Estrogen is contraindicated due to BRCA1 Is taking vitamin supplements. Wonders if she is still low. Some aches and pains, hip and knees.  H/o EE and was treated by [facility] > 10 years ago. Having choking sensation again. No n/v/ or reflux sxs. No melena.   Assessment  1. Encounter for well adult exam with abnormal findings   2. BRCA gene mutation positive   3. Tubular adenoma of colon   4. Morbid obesity (HCC)   5. Essential tremor   6. Snoring   7. Other fatigue   8. Menopausal symptoms   9. Vitamin B12 deficiency   10. Vitamin D  deficiency   11. Eosinophilic esophagitis      Plan  Female Wellness Visit: Age appropriate Health  Maintenance and Prevention measures were discussed with patient. Included topics are cancer screening recommendations, ways to keep healthy (see AVS) including dietary and exercise recommendations, regular eye and dental care, use of seat belts, and avoidance of moderate alcohol use and tobacco use. Screens are current BMI: discussed patient's BMI and encouraged positive lifestyle modifications to help get to or maintain a target BMI. HM needs and immunizations were addressed and ordered. See below for orders. See HM and immunization section for updates. Educated on prevnar; pt to consider but defers today Routine labs and screening tests ordered including cmp, cbc and lipids where appropriate. Discussed recommendations regarding Vit D and calcium supplementation (see AVS)  Chronic disease management visit and/or acute problem visit: Fatigue: likely multifactorial but concern for sleep apnea; refer to pulm for testing. Check labs as well.  Menopausal sxs; behavioral strategies discussed for mgt. F/u if worsen.  Obesity: will rec zepbound if + sleep apnea. Discussed diet and exercise.  Discussed sleep hygiene.  Rec f/u with GI to discuss and evaluate for EE treatment.  Check d and b12 Return if needs help with joint pains. No red flags.  Follow up: 12 mo for cpe  Orders Placed This Encounter  Procedures   HM PAP SMEAR   Vitamin B12   VITAMIN D  25 Hydroxy (Vit-D Deficiency, Fractures)   CBC with Differential/Platelet   Comprehensive metabolic panel with  GFR   Lipid panel   Hemoglobin A1c   TSH   Ambulatory referral to Pulmonology   No orders of the defined types were placed in this encounter.     Body mass index is 42 kg/m. Wt Readings from Last 3 Encounters:  07/27/24 276 lb 3.2 oz (125.3 kg)  07/26/23 264 lb (119.7 kg)  07/09/23 261 lb 1.6 oz (118.4 kg)     Patient Active Problem List   Diagnosis Date Noted   Tubular adenoma of colon 07/26/2023    Priority: High     Colonoscopy Dr. Albertus, 09/2022, repeat in 7 years    Family history of BRCA gene mutation 02/20/2021    Priority: High   BRCA gene mutation positive 09/20/2019    Priority: High    Prophylactic BSO 06/2023, Dr. 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 Lavona: nl stress echo (previously) and heart monitor with PVCs. asymptomatic    Seasonal allergic rhinitis due to pollen 07/22/2022    Priority: Low   Essential tremor 10/24/2019    Priority: Low   Eosinophilic esophagitis 07/27/2024   Vitamin D  deficiency 07/27/2024   Vitamin B12 deficiency 07/27/2024   Snoring 07/27/2024   Health Maintenance  Topic Date Due   Hepatitis B Vaccines 19-59 Average Risk (1 of 3 - 19+ 3-dose series) Never done   INFLUENZA VACCINE  07/14/2024   COVID-19 Vaccine (4 - 2024-25 season) 08/12/2024 (Originally 08/15/2023)   Pneumococcal Vaccine: 50+ Years (1 of 1 - PCV) 07/27/2025 (Originally 02/09/2021)   MAMMOGRAM  09/19/2024   Cervical Cancer Screening (HPV/Pap Cotest)  05/04/2028   Colonoscopy  09/23/2029   DTaP/Tdap/Td (2 - Td or Tdap) 10/23/2029   Hepatitis C Screening  Completed   HIV Screening  Completed   Zoster Vaccines- Shingrix   Completed   HPV VACCINES  Aged Out   Meningococcal B Vaccine  Aged Out   Immunization History  Administered Date(s) Administered   Influenza Inj Mdck Quad Pf 11/07/2019   Influenza, Seasonal, Injecte, Preservative Fre 09/23/2023   Influenza,inj,Quad PF,6-35 Mos 09/13/2020   PFIZER(Purple Top)SARS-COV-2 Vaccination 02/16/2020, 03/20/2020, 12/04/2020   Tdap 10/24/2019   Zoster Recombinant(Shingrix ) 03/10/2021, 11/11/2021   We updated and reviewed the patient's past history in detail and it is documented below. Allergies: Patient is allergic to other. Past  Medical History Patient  has a past medical history of Allergic rhinitis, Anxiety, basal cell carcinoma, BRCA2 gene mutation positive, Eosinophilic esophagitis, Family history of breast cancer, history of Frequent PVCs (2013), Obesity, Palpitations, and Wears contact lenses. Past Surgical History Patient  has a past surgical history that includes Adenoidectomy; Breast biopsy (Right, 10/24/2021); Upper gastrointestinal endoscopy; basal cell carcimona removed (2018); and Laparoscopic bilateral salpingo oophorectomy (Bilateral, 07/09/2023). Family History: Patient family history includes Arthritis in her paternal grandmother; Asthma in her paternal grandmother; BRCA 1/2 in her mother and sister; Breast cancer (age of onset: 61) in her mother; Cancer in her maternal grandfather and paternal grandfather; Healthy in her sister; Hearing loss in her paternal grandfather and paternal grandmother; Heart attack in her maternal grandfather; Heart disease in her maternal grandfather; High Cholesterol in her maternal grandmother; Hyperlipidemia in her mother; Hypertension in her mother; Neurologic Disorder in her father; Stroke  in her maternal grandmother. Social History:  Patient  reports that she has never smoked. She has never used smokeless tobacco. She reports current alcohol use. She reports that she does not use drugs.  Review of Systems: Constitutional: negative for fever or malaise Ophthalmic: negative for photophobia, double vision or loss of vision Cardiovascular: negative for chest pain, dyspnea on exertion, or new LE swelling Respiratory: negative for SOB or persistent cough Gastrointestinal: negative for abdominal pain, change in bowel habits or melena Genitourinary: negative for dysuria or gross hematuria, no abnormal uterine bleeding or disharge Musculoskeletal: negative for new gait disturbance or muscular weakness Integumentary: negative for new or persistent rashes, no breast  lumps Neurological: negative for TIA or stroke symptoms Psychiatric: negative for SI or delusions Allergic/Immunologic: negative for hives  Patient Care Team    Relationship Specialty Notifications Start End  Jodie Lavern CROME, MD PCP - General Family Medicine  09/20/19   Delana Ted Morrison, DO Consulting Physician Obstetrics and Gynecology  09/20/19   Lavona Agent, MD Consulting Physician Cardiology  07/22/22   Albertus Gordy HERO, MD Consulting Physician Gastroenterology  07/26/23     Objective  Vitals: BP 125/84   Pulse 75   Temp 97.7 F (36.5 C)   Ht 5' 8 (1.727 m)   Wt 276 lb 3.2 oz (125.3 kg)   SpO2 97%   BMI 42.00 kg/m  General:  Well developed, well nourished, no acute distress  Psych:  Alert and orientedx3,normal mood and affect HEENT:  Normocephalic, atraumatic, non-icteric sclera,  supple neck without adenopathy, mass or thyromegaly Cardiovascular:  Normal S1, S2, RRR without gallop, rub or murmur Respiratory:  Good breath sounds bilaterally, CTAB with normal respiratory effort Gastrointestinal: normal bowel sounds, soft, non-tender, no noted masses. No HSM MSK: extremities without edema, joints without erythema or swelling Neurologic:    Mental status is normal.  Gross motor and sensory exams are normal.    Commons side effects, risks, benefits, and alternatives for medications and treatment plan prescribed today were discussed, and the patient expressed understanding of the given instructions. Patient is instructed to call or message via MyChart if he/she has any questions or concerns regarding our treatment plan. No barriers to understanding were identified. We discussed Red Flag symptoms and signs in detail. Patient expressed understanding regarding what to do in case of urgent or emergency type symptoms.  Medication list was reconciled, printed and provided to the patient in AVS. Patient instructions and summary information was reviewed with the patient as documented in the  AVS. This note was prepared with assistance of Dragon voice recognition software. Occasional wrong-word or sound-a-like substitutions may have occurred due to the inherent limitations of voice recognition software

## 2024-07-30 ENCOUNTER — Ambulatory Visit: Payer: Self-pay | Admitting: Family Medicine

## 2024-07-30 NOTE — Progress Notes (Signed)
 See mychart note The 10-year ASCVD risk score (Arnett DK, et al., 2019) is: 1.5%   Values used to calculate the score:     Age: 53 years     Clincally relevant sex: Female     Is Non-Hispanic African American: No     Diabetic: No     Tobacco smoker: No     Systolic Blood Pressure: 125 mmHg     Is BP treated: No     HDL Cholesterol: 56.5 mg/dL     Total Cholesterol: 202 mg/dL

## 2024-09-19 ENCOUNTER — Ambulatory Visit: Admitting: Adult Health

## 2024-09-19 ENCOUNTER — Encounter: Payer: Self-pay | Admitting: Adult Health

## 2024-09-19 VITALS — BP 122/86 | HR 75 | Temp 98.2°F | Ht 68.0 in | Wt 277.0 lb

## 2024-09-19 DIAGNOSIS — G479 Sleep disorder, unspecified: Secondary | ICD-10-CM | POA: Diagnosis not present

## 2024-09-19 DIAGNOSIS — R0683 Snoring: Secondary | ICD-10-CM

## 2024-09-19 DIAGNOSIS — Z6841 Body Mass Index (BMI) 40.0 and over, adult: Secondary | ICD-10-CM

## 2024-09-19 DIAGNOSIS — E669 Obesity, unspecified: Secondary | ICD-10-CM | POA: Diagnosis not present

## 2024-09-19 DIAGNOSIS — R400 Somnolence: Secondary | ICD-10-CM

## 2024-09-19 DIAGNOSIS — Z87891 Personal history of nicotine dependence: Secondary | ICD-10-CM

## 2024-09-19 NOTE — Patient Instructions (Signed)
Set up for home sleep study  ?Work on healthy weight loss.  ?Do not drive if sleepy  ?Follow up in 6 weeks and As needed   ? ?

## 2024-09-19 NOTE — Progress Notes (Signed)
 @Patient  ID: Becky Taylor, female    DOB: 1971-04-06, 53 y.o.   MRN: [id number]  Chief Complaint  Patient presents with   Consult    Sleep    Referring provider: Jodie Lavern CROME, MD  HPI: 53 yo female seen for sleep consult 09/19/24 for snoring and daytime sleepiness     TEST/EVENTS :  Discussed the use of AI scribe software for clinical note transcription with the patient, who gave verbal consent to proceed.  History of Present Illness Becky Taylor is a 53 year old female who presents with sleep disturbances and suspected sleep apnea. She was referred by her dentist and primary care  for evaluation of potential sleep issues.  She experiences non-restorative sleep, feeling unrested upon waking, and has been informed of her snoring. She has increased restlessness at night and grinds her teeth. Her sleep schedule includes going to bed around 10:30 PM and waking up at 6:00 AM, yet she still feels tired. No headaches or unusual nighttime events such as sleep terrors, sleepwalking, or sleep paralysis are reported. She rarely naps and does not experience daytime nodding off but feels she could nap in the afternoons.  She consumes two to three cups of coffee in the morning and something caffeinated in the afternoon, totaling three to four caffeinated drinks per day. She does not take any sleep aids and reports her current weight is about normal for her. Current weight is 277lbs with bmi at 42.   Her past medical history includes chronic allergies and being a carrier of the BRCA gene, which led to the removal of her ovaries. She has had a negative breast biopsy. She denies any history of congestive heart failure or stroke.  Family history is significant for breast cancer, and her father is suspected to have sleep apnea, though he is not on CPAP therapy.  Socially, she does not smoke, drinks alcohol socially, and denies drug use. She works as a Production manager on a day shift. She is  married with two adult daughters aged 62 and 36. She lost 85 pounds on Weight Watchers in 2020 but has regained about 60 pounds over the past year due to stress. She tries to walk two to three times a week but acknowledges needing to do more.      09/19/2024    9:00 AM  Results of the Epworth flowsheet  Sitting and reading 1  Watching TV 2  Sitting, inactive in a public place (e.g. a theatre or a meeting) 0  As a passenger in a car for an hour without a break 1  Lying down to rest in the afternoon when circumstances permit 2  Sitting and talking to someone 0  Sitting quietly after a lunch without alcohol 0  In a car, while stopped for a few minutes in traffic 0  Total score 6     Allergies  Allergen Reactions   Other Anaphylaxis    Watermelon, sweet potatoes, cucumbers,and oranges.    Immunization History  Administered Date(s) Administered   Influenza Inj Mdck Quad Pf 11/07/2019   Influenza, Seasonal, Injecte, Preservative Fre 09/23/2023   Influenza,inj,Quad PF,6-35 Mos 09/13/2020   PFIZER(Purple Top)SARS-COV-2 Vaccination 02/16/2020, 03/20/2020, 12/04/2020   Tdap 10/24/2019   Zoster Recombinant(Shingrix ) 03/10/2021, 11/11/2021    Past Medical History:  Diagnosis Date   Allergic rhinitis    Anxiety    basal cell carcinoma    BRCA2 gene mutation positive    Eosinophilic esophagitis    Hx of  Family history of breast cancer    history of Frequent PVCs 2013   Obesity    Palpitations    Wears contact lenses     Tobacco History: Social History   Tobacco Use  Smoking Status Never  Smokeless Tobacco Never  Tobacco Comments   Smoked sporadically when she was 20   Counseling given: Not Answered Tobacco comments: Smoked sporadically when she was 20   Outpatient Medications Prior to Visit  Medication Sig Dispense Refill   ibuprofen  (ADVIL ) 600 MG tablet Take 1 tablet (600 mg total) by mouth every 6 (six) hours as needed for moderate pain or cramping. 30  tablet 0   loratadine-pseudoephedrine (CLARITIN-D 12-HOUR) 5-120 MG tablet Take 1 tablet by mouth daily at 6 (six) AM.     fluticasone  (FLONASE ) 50 MCG/ACT nasal spray Place 1 spray into both nostrils daily. (Patient not taking: Reported on 09/19/2024) 16 g 6   No facility-administered medications prior to visit.     Review of Systems:   Constitutional:   No  weight loss, night sweats,  Fevers, chills,+ fatigue, or  lassitude.  HEENT:   No headaches,  Difficulty swallowing,  Tooth/dental problems, or  Sore throat,                No sneezing, itching, ear ache, nasal congestion, post nasal drip,   CV:  No chest pain,  Orthopnea, PND, swelling in lower extremities, anasarca, dizziness, palpitations, syncope.   GI  No heartburn, indigestion, abdominal pain, nausea, vomiting, diarrhea, change in bowel habits, loss of appetite, bloody stools.   Resp: No shortness of breath with exertion or at rest.  No excess mucus, no productive cough,  No non-productive cough,  No coughing up of blood.  No change in color of mucus.  No wheezing.  No chest wall deformity  Skin: no rash or lesions.  GU: no dysuria, change in color of urine, no urgency or frequency.  No flank pain, no hematuria   MS:  No joint pain or swelling.  No decreased range of motion.  No back pain.    Physical Exam  BP 122/86   Pulse 75   Temp 98.2 F (36.8 C)   Ht 5' 8 (1.727 m)   Wt 277 lb (125.6 kg)   SpO2 98% Comment: RA  BMI 42.12 kg/m   GEN: A/Ox3; pleasant , NAD, well nourished    HEENT:  [place]/AT,  NOSE-clear, THROAT-clear, no lesions, no postnasal drip or exudate noted. Class 3 MP airway   NECK:  Supple w/ fair ROM; no JVD; normal carotid impulses w/o bruits; no thyromegaly or nodules palpated; no lymphadenopathy.    RESP  Clear  P & A; w/o, wheezes/ rales/ or rhonchi. no accessory muscle use, no dullness to percussion  CARD:  RRR, no m/r/g, no peripheral edema, pulses intact, no cyanosis or clubbing.  GI:    Soft & nt; nml bowel sounds; no organomegaly or masses detected.   Musco: Warm bil, no deformities or joint swelling noted.   Neuro: alert, no focal deficits noted.    Skin: Warm, no lesions or rashes    Lab Results:  CBC    Component Value Date/Time   WBC 4.8 07/27/2024 0941   RBC 4.78 07/27/2024 0941   HGB 13.4 07/27/2024 0941   HCT 40.6 07/27/2024 0941   PLT 282.0 07/27/2024 0941   MCV 85.1 07/27/2024 0941   MCH 28.9 07/09/2023 0635   MCHC 32.9 07/27/2024 0941   RDW 14.5  07/27/2024 0941   LYMPHSABS 1.2 07/27/2024 0941   MONOABS 0.3 07/27/2024 0941   EOSABS 0.5 07/27/2024 0941   BASOSABS 0.1 07/27/2024 0941    BMET    Component Value Date/Time   NA 141 07/27/2024 0941   K 4.2 07/27/2024 0941   CL 106 07/27/2024 0941   CO2 28 07/27/2024 0941   GLUCOSE 93 07/27/2024 0941   BUN 11 07/27/2024 0941   CREATININE 0.72 07/27/2024 0941   CALCIUM 9.0 07/27/2024 0941    BNP No results found for: BNP  ProBNP No results found for: PROBNP  Imaging: No results found.  Administration History     None           No data to display          No results found for: NITRICOXIDE      Assessment & Plan:   Assessment and Plan Assessment & Plan Sleep disturbance with suspected obstructive sleep apnea   She reports feeling unrested upon waking, snoring, and increased restlessness at night, suggesting obstructive sleep apnea due to symptoms, BMI  and family history.  Becky Taylor Potential long-term effects include heart problems, diabetes, and cognitive issues. Diagnosis and treatment are crucial to improve quality of life and prevent complications such as heart arrhythmias, stroke, and dementia. CPAP is the gold standard treatment for moderate/severe OSA, with other options like weight loss, positional therapy, dental devices, and the Inspire device for mod/severe cases-intolerant to CPAP. Order a home sleep study through [facility] to confirm the diagnosis.  Schedule a follow-up appointment in six weeks to review sleep study results and discuss treatment options based on severity. - discussed how weight can impact sleep and risk for sleep disordered breathing - discussed options to assist with weight loss: combination of diet modification, cardiovascular and strength training exercises   - had an extensive discussion regarding the adverse health consequences related to untreated sleep disordered breathing - specifically discussed the risks for hypertension, coronary artery disease, cardiac dysrhythmias, cerebrovascular disease, and diabetes - lifestyle modification discussed   - discussed how sleep disruption can increase risk of accidents, particularly when driving - safe driving practices were discussed     Obesity   She experienced significant weight loss followed by gradual regain, with weight gain noted as a potential contributing factor to sleep apnea. Weight loss is a potential treatment for sleep apnea, offering benefits in reducing symptoms and improving overall health. Weight loss drugs may improve sleep apnea through weight reduction, depending on criteria and insurance coverage. Discuss weight loss strategies as part of the management plan for sleep apnea during the follow-up visit.        Madelin Stank, NP 09/19/2024

## 2024-10-31 ENCOUNTER — Ambulatory Visit: Admitting: Adult Health

## 2024-12-22 ENCOUNTER — Telehealth: Payer: Self-pay

## 2024-12-22 NOTE — Telephone Encounter (Signed)
 ATC LVMTCB - we will need to reschedule appt with Tammy Parrett on 12/22/24 as pt's HST has not been completed.

## 2024-12-25 ENCOUNTER — Ambulatory Visit: Admitting: Adult Health

## 2024-12-25 NOTE — Telephone Encounter (Signed)
 ATC x2 - unable to LVM as VM box is full.

## 2025-07-30 ENCOUNTER — Encounter: Admitting: Family Medicine
